# Patient Record
Sex: Male | Born: 1985 | Race: White | Hispanic: No | Marital: Married | Smoking: Never smoker
Health system: Southern US, Community
[De-identification: ages and names within clinical notes are randomized; demographics above are authoritative.]

## PROBLEM LIST (undated history)

## (undated) DIAGNOSIS — I10 Essential (primary) hypertension: Secondary | ICD-10-CM

## (undated) DIAGNOSIS — E119 Type 2 diabetes mellitus without complications: Secondary | ICD-10-CM

## (undated) DIAGNOSIS — E669 Obesity, unspecified: Secondary | ICD-10-CM

## (undated) DIAGNOSIS — R7303 Prediabetes: Secondary | ICD-10-CM

## (undated) HISTORY — PX: WISDOM TOOTH EXTRACTION: SHX21

---

## 2007-05-10 ENCOUNTER — Emergency Department (HOSPITAL_COMMUNITY): Admission: EM | Admit: 2007-05-10 | Discharge: 2007-05-10 | Payer: Self-pay | Admitting: Emergency Medicine

## 2011-04-02 LAB — CULTURE, BLOOD (ROUTINE X 2): Culture: NO GROWTH

## 2011-04-02 LAB — COMPREHENSIVE METABOLIC PANEL
ALT: 52
AST: 32
Albumin: 3.7
Alkaline Phosphatase: 57
BUN: 9
CO2: 27
Calcium: 9.3
Chloride: 102
Creatinine, Ser: 1.05
GFR calc Af Amer: >60
GFR calc non Af Amer: >60
Glucose, Bld: 117 — ABNORMAL HIGH
Potassium: 3.8
Sodium: 137
Total Bilirubin: 0.9
Total Protein: 6.8

## 2011-04-02 LAB — CBC
HCT: 41.6
Hemoglobin: 14.6
MCHC: 35.2
MCV: 84.3
Platelets: 203
RBC: 4.93
RDW: 12.9
WBC: 10.4

## 2011-04-02 LAB — URINALYSIS, ROUTINE W REFLEX MICROSCOPIC
Bilirubin Urine: NEGATIVE
Hgb urine dipstick: NEGATIVE
Ketones, ur: 15 — AB
Leukocytes, UA: NEGATIVE
Nitrite: NEGATIVE
pH: 7.5

## 2011-04-02 LAB — INFLUENZA A+B VIRUS AG-DIRECT(RAPID)
Inflenza A Ag: NEGATIVE
Influenza B Ag: NEGATIVE

## 2011-04-02 LAB — DIFFERENTIAL
Basophils Absolute: 0
Neutrophils Relative %: 85 — ABNORMAL HIGH

## 2011-04-02 LAB — RAPID STREP SCREEN (MED CTR MEBANE ONLY): Streptococcus, Group A Screen (Direct): NEGATIVE

## 2011-04-02 LAB — URINE MICROSCOPIC-ADD ON

## 2013-11-21 ENCOUNTER — Emergency Department: Payer: Self-pay | Admitting: Emergency Medicine

## 2013-11-23 ENCOUNTER — Emergency Department: Payer: Self-pay | Admitting: Emergency Medicine

## 2014-07-21 ENCOUNTER — Emergency Department: Payer: Self-pay | Admitting: Emergency Medicine

## 2015-02-04 ENCOUNTER — Emergency Department (HOSPITAL_COMMUNITY)
Admission: EM | Admit: 2015-02-04 | Discharge: 2015-02-05 | Disposition: A | Discharge status: Home / Self Care | Payer: Self-pay | Attending: Emergency Medicine | Admitting: Emergency Medicine

## 2015-02-04 ENCOUNTER — Encounter (HOSPITAL_COMMUNITY): Payer: Self-pay | Admitting: Emergency Medicine

## 2015-02-04 DIAGNOSIS — L600 Ingrowing nail: Secondary | ICD-10-CM | POA: Insufficient documentation

## 2015-02-04 HISTORY — DX: Prediabetes: R73.03

## 2015-02-04 LAB — CBG MONITORING, ED: GLUCOSE-CAPILLARY: 112 mg/dL — AB (ref 65–99)

## 2015-02-04 MED ORDER — LIDOCAINE HCL (PF) 1 % IJ SOLN
30.0000 mL | Freq: Once | INTRAMUSCULAR | Status: AC
  Administered 2015-02-05: 30 mL via INTRADERMAL
  Filled 2015-02-04: qty 30

## 2015-02-04 NOTE — ED Notes (Signed)
Patient here with right great toe pain. States presence of ingrown toenail. States he cut it out at home with a pair of nail cutters. Since that time pain has increased. Toe appears mildly swollen, with bloody skin flap on medial aspect, color similar to rest of foot. Ambulatory with pain.

## 2015-02-04 NOTE — ED Provider Notes (Signed)
CSN: [id number]     Arrival date & time 02/04/15  2157 History  This chart was scribed for John Forth, PA-C, working with John Nay, MD by Elon Spanner, ED Scribe. This patient was seen in room TR06C/TR06C and the patient's care was started at 11:19 PM.   Chief Complaint  Patient presents with  . Nail Problem  . Toe Pain   The history is provided by the patient. No language interpreter was used.    HPI Comments: John Koch is a 29 y.o. male who presents to the Emergency Department complaining of stinging/burning/tingling/throbbing right toe pain onset weeks ago with exacerbation 5 hoursago.  He reports he used an OTC implement to remove a painful in-grown great toe and has had pain and moderate drainage since.  This evening, his daughter jumped onto the toe twice, causing an exacerbation of the pain.  He has cleaned the area with peroxide.  The pain is worsened with touch and the patient stands a significant amount for his job.  The patient has taken ibuprofen.  He denies fever chills, nausea, vomiting.  Past Medical History  Diagnosis Date  . Borderline diabetes mellitus    History reviewed. No pertinent past surgical history. History reviewed. No pertinent family history. Social History  Substance Use Topics  . Smoking status: Never Smoker   . Smokeless tobacco: None  . Alcohol Use: Yes    Review of Systems  Constitutional: Negative for fever, chills, diaphoresis, appetite change, fatigue and unexpected weight change.  HENT: Negative for mouth sores.   Eyes: Negative for visual disturbance.  Respiratory: Negative for cough, chest tightness, shortness of breath and wheezing.   Cardiovascular: Negative for chest pain.  Gastrointestinal: Negative for nausea, vomiting, abdominal pain, diarrhea and constipation.  Endocrine: Negative for polydipsia, polyphagia and polyuria.  Genitourinary: Negative for dysuria, urgency, frequency and hematuria.  Musculoskeletal:  Negative for back pain and neck stiffness.  Skin: Positive for color change and wound. Negative for rash.  Allergic/Immunologic: Negative for immunocompromised state.  Neurological: Negative for syncope, light-headedness and headaches.  Hematological: Does not bruise/bleed easily.  Psychiatric/Behavioral: Negative for sleep disturbance. The patient is not nervous/anxious.       Allergies  Review of patient's allergies indicates no known allergies.  Home Medications   Prior to Admission medications   Medication Sig Start Date End Date Taking? Authorizing Provider  cephALEXin (KEFLEX) 500 MG capsule Take 1 capsule (500 mg total) by mouth 4 (four) times daily. 02/05/15   Sadey Yandell, PA-C   BP 127/63 mmHg  Pulse 105  Temp(Src) 98.8 F (37.1 C)  Resp 16  Ht 5\' 8"  (1.727 m)  Wt 339 lb 4.8 oz (153.905 kg)  BMI 51.60 kg/m2  SpO2 100% Physical Exam  Constitutional: He appears well-developed and well-nourished. No distress.  HENT:  Head: Normocephalic and atraumatic.  Eyes: Conjunctivae are normal.  Neck: Normal range of motion.  Cardiovascular: Normal rate, regular rhythm and intact distal pulses.   Capillary refill < 3 sec  Pulmonary/Chest: Effort normal and breath sounds normal.  Musculoskeletal: He exhibits tenderness. He exhibits no edema.  ROM: FROM of all joints in the right lower extremity.  Neurological: He is alert. Coordination normal.  Sensation intact to dull and sharp Strength 5/5 in lower extremity  Skin: Skin is warm and dry. He is not diaphoretic.  Right great toe with erythema and swelling to the lateral fold with significant TTP, evidence of in-grown toe nail  Psychiatric: He has a normal mood and  affect.  Nursing note and vitals reviewed.   ED Course  NAIL REMOVAL Date/Time: 02/05/2015 12:02 AM Performed by: John Koch Authorized by: John Koch Consent: Verbal consent obtained. Risks and benefits: risks, benefits and  alternatives were discussed Consent given by: patient Patient understanding: patient states understanding of the procedure being performed Patient consent: the patient's understanding of the procedure matches consent given Procedure consent: procedure consent matches procedure scheduled Relevant documents: relevant documents present and verified Site marked: the operative site was marked Required items: required blood products, implants, devices, and special equipment available Patient identity confirmed: verbally with patient and arm band Time out: Immediately prior to procedure a "time out" was called to verify the correct patient, procedure, equipment, support staff and site/side marked as required. Location: right foot Location details: right big toe Anesthesia: digital block Local anesthetic: lidocaine 2% with epinephrine Anesthetic total: 6 ml Patient sedated: no Preparation: skin prepped with alcohol Amount removed: 1/3 Side: ulnar Wedge excision of skin of nail fold: no Nail bed sutured: no Dressing: 4x4 Patient tolerance: Patient tolerated the procedure well with no immediate complications   (including critical care time)  DIAGNOSTIC STUDIES: Oxygen Saturation is 92% on RA, adequate by my interpretation.    COORDINATION OF CARE:   11:26 PM Discussed treatment plan with patient at bedside.  Patient acknowledges and agrees with plan.    Labs Review Labs Reviewed  CBG MONITORING, ED - Abnormal; Notable for the following:    Glucose-Capillary 112 (*)    All other components within normal limits    Imaging Review No results found. I personally reviewed and evaluated these images and lab results as part of my medical decision-making.   EKG Interpretation None      MDM   Final diagnoses:  Ingrown left greater toenail   [name] presents with ingrown toenail recurrent for many years but worse in the last several days. Toenail removal done here in the  emergency department with periodic drainage. Wound cleaned. No need for suturing of the nailbed. Patient discharged home with Keflex and referred to [facility]. No evidence of spreading cellulitis or systemic infection.  BP 127/63 mmHg  Pulse 105  Temp(Src) 98.8 F (37.1 C)  Resp 16  Ht  (1.727 m)  Wt 339 lb 4.8 oz (153.905 kg)  BMI 51.60 kg/m2  SpO2 100%  I personally performed the services described in this documentation, which was scribed in my presence. The recorded information has been reviewed and is accurate.   Dahlia Client Noemy Hallmon, PA-C 02/05/15 0300  John Nay, MD 02/05/15 [phone]

## 2015-02-04 NOTE — ED Notes (Signed)
Reports borderline DM. CBG 112 in triage.

## 2015-02-04 NOTE — ED Notes (Signed)
Denies fever.

## 2015-02-05 MED ORDER — CEPHALEXIN 500 MG PO CAPS: 500.0000 mg | ORAL_CAPSULE | Freq: Four times a day (QID) | ORAL | Status: DC

## 2015-02-05 NOTE — Discharge Instructions (Signed)
1. Medications: Keflex, usual home medications 2. Treatment: rest, drink plenty of fluids, keep area clean; soak with warm water 2x per day; use only soap and water 3. Follow Up: Please followup with the foot center in 3 days for discussion of your diagnoses and further evaluation after today's visit; if you do not have a primary care doctor use the resource guide provided to find one; Please return to the ER for  Fever, chills, worsening symptoms    Infected Ingrown Toenail An infected ingrown toenail occurs when the nail edge grows into the skin and bacteria invade the area. Symptoms include pain, tenderness, swelling, and pus drainage from the edge of the nail. Poorly fitting shoes, minor injuries, and improper cutting of the toenail may also contribute to the problem. You should cut your toenails squarely instead of rounding the edges. Do not cut them too short. Avoid tight or pointed toe shoes. Sometimes the ingrown portion of the nail must be removed. If your toenail is removed, it can take 3-4 months for it to re-grow. HOME CARE INSTRUCTIONS   Soak your infected toe in warm water for 20-30 minutes, 2 to 3 times a day.  Packing or dressings applied to the area should be changed daily.  Take medicine as directed and finish them.  Reduce activities and keep your foot elevated when able to reduce swelling and discomfort. Do this until the infection gets better.  Wear sandals or go barefoot as much as possible while the infected area is sensitive.  See your caregiver for follow-up care in 2-3 days if the infection is not better. SEEK MEDICAL CARE IF:  Your toe is becoming more red, swollen or painful. MAKE SURE YOU:   Understand these instructions.  Will watch your condition.  Will get help right away if you are not doing well or get worse. Document Released: 07/18/2004 Document Revised: 09/02/2011 Document Reviewed: 06/06/2008 [facility] Patient Information 2015 [place], [place]. This  information is not intended to replace advice given to you by your health care provider. Make sure you discuss any questions you have with your health care provider.

## 2015-02-05 NOTE — ED Notes (Addendum)
Pt left at this time, left black LG cell phone charger in room. Turned into security.

## 2015-04-10 ENCOUNTER — Other Ambulatory Visit: Payer: Self-pay | Admitting: Family Medicine

## 2015-04-10 ENCOUNTER — Ambulatory Visit
Admission: RE | Admit: 2015-04-10 | Discharge: 2015-04-10 | Disposition: A | Discharge status: Home / Self Care | Payer: BLUE CROSS/BLUE SHIELD | Source: Ambulatory Visit | Attending: Family Medicine | Admitting: Family Medicine

## 2015-04-10 DIAGNOSIS — R109 Unspecified abdominal pain: Secondary | ICD-10-CM

## 2015-06-25 ENCOUNTER — Emergency Department (INDEPENDENT_AMBULATORY_CARE_PROVIDER_SITE_OTHER): Payer: BLUE CROSS/BLUE SHIELD

## 2015-06-25 ENCOUNTER — Emergency Department (INDEPENDENT_AMBULATORY_CARE_PROVIDER_SITE_OTHER)
Admission: EM | Admit: 2015-06-25 | Discharge: 2015-06-25 | Disposition: A | Discharge status: Home / Self Care | Payer: BLUE CROSS/BLUE SHIELD | Source: Home / Self Care

## 2015-06-25 ENCOUNTER — Encounter (HOSPITAL_COMMUNITY): Payer: Self-pay | Admitting: *Deleted

## 2015-06-25 DIAGNOSIS — M25562 Pain in left knee: Secondary | ICD-10-CM

## 2015-06-25 HISTORY — DX: Obesity, unspecified: E66.9

## 2015-06-25 MED ORDER — TRAMADOL HCL 50 MG PO TABS: 50.0000 mg | ORAL_TABLET | Freq: Four times a day (QID) | ORAL | Status: DC | PRN

## 2015-06-25 NOTE — ED Provider Notes (Signed)
CSN: [id number]     Arrival date & time 06/25/15  1510 History   None    Chief Complaint  Patient presents with  . Knee Pain   (Consider location/radiation/quality/duration/timing/severity/associated sxs/prior Treatment) HPI Left knee pain for the last few days, pain score 4, ibuprofen for pain not helping. Also using icy hot. Works as [employer]. Is on knees a lot.  Past Medical History  Diagnosis Date  . Borderline diabetes mellitus   . Obesity    Past Surgical History  Procedure Laterality Date  . Wisdom tooth extraction     No family history on file. Social History  Substance Use Topics  . Smoking status: Never Smoker   . Smokeless tobacco: Current User    Types: Chew  . Alcohol Use: No    Review of Systems ROS +'ve left knee pain, no known trauma  Denies: HEADACHE, NAUSEA, ABDOMINAL PAIN, CHEST PAIN, CONGESTION, DYSURIA, SHORTNESS OF BREATH  Allergies  Review of patient's allergies indicates no known allergies.  Home Medications   Prior to Admission medications   Medication Sig Start Date End Date Taking? Authorizing Provider  cephALEXin (KEFLEX) 500 MG capsule Take 1 capsule (500 mg total) by mouth 4 (four) times daily. 02/05/15   Hannah Muthersbaugh, PA-C  traMADol (ULTRAM) 50 MG tablet Take 1 tablet (50 mg total) by mouth every 6 (six) hours as needed. 06/25/15   Tharon AquasFrank C Patrick, PA   Meds Ordered and Administered this Visit  Medications - No data to display  BP 112/82 mmHg  Pulse 113  Temp(Src) 97.7 F (36.5 C) (Oral)  Resp 18  SpO2 96% No data found.   Physical Exam  Constitutional: He appears well-developed and well-nourished.  HENT:  Head: Normocephalic and atraumatic.  Musculoskeletal: He exhibits tenderness.       Left knee: He exhibits normal range of motion, no swelling and no effusion. Tenderness found. Medial joint line tenderness noted.       Legs: Nursing note and vitals reviewed.   ED Course  Procedures (including critical care  time)  Labs Review Labs Reviewed - No data to display  Imaging Review Dg Knee Complete 4 Views Left  06/25/2015  CLINICAL DATA:  Pain in LEFT knee for 2 days. No known injury. History of obesity with a BMI of 51.6 reported in August of 2016. EXAM: LEFT KNEE - COMPLETE 4+ VIEW COMPARISON:  None. FINDINGS: There is no evidence of fracture, dislocation, or joint effusion. There is no evidence of arthropathy or other focal bone abnormality. No acute soft tissue findings. IMPRESSION: Negative. Electronically Signed   By: Elsie StainJohn T Curnes M.D.   On: 06/25/2015 17:15     Visual Acuity Review  Right Eye Distance:   Left Eye Distance:   Bilateral Distance:    Right Eye Near:   Left Eye Near:    Bilateral Near:         MDM   1. Knee pain, acute, left    Reviewed xray with patient. Suggest continued symptomatic treatment.  rx for tramadol.  Follow up with PCP.     Tharon AquasFrank C Patrick, PA 06/25/15 [phone]

## 2015-06-25 NOTE — Discharge Instructions (Signed)
Heat Therapy  Heat therapy can help ease sore, stiff, injured, and tight muscles and joints. Heat relaxes your muscles, which may help ease your pain.   RISKS AND COMPLICATIONS  If you have any of the following conditions, do not use heat therapy unless your health care provider has approved:   Poor circulation.   Healing wounds or scarred skin in the area being treated.   Diabetes, heart disease, or high blood pressure.   Not being able to feel (numbness) the area being treated.   Unusual swelling of the area being treated.   Active infections.   Blood clots.   Cancer.   Inability to communicate pain. This may include young children and people who have problems with their brain function (dementia).   Pregnancy.  Heat therapy should only be used on old, pre-existing, or long-lasting (chronic) injuries. Do not use heat therapy on new injuries unless directed by your health care provider.  HOW TO USE HEAT THERAPY  There are several different kinds of heat therapy, including:   Moist heat pack.   Warm water bath.   Hot water bottle.   Electric heating pad.   Heated gel pack.   Heated wrap.   Electric heating pad.  Use the heat therapy method suggested by your health care provider. Follow your health care provider's instructions on when and how to use heat therapy.  GENERAL HEAT THERAPY RECOMMENDATIONS   Do not sleep while using heat therapy. Only use heat therapy while you are awake.   Your skin may turn pink while using heat therapy. Do not use heat therapy if your skin turns red.   Do not use heat therapy if you have new pain.   High heat or long exposure to heat can cause burns. Be careful when using heat therapy to avoid burning your skin.   Do not use heat therapy on areas of your skin that are already irritated, such as with a rash or sunburn.  SEEK MEDICAL CARE IF:   You have blisters, redness, swelling, or numbness.   You have new pain.   Your pain is worse.  MAKE SURE  YOU:   Understand these instructions.   Will watch your condition.   Will get help right away if you are not doing well or get worse.     This information is not intended to replace advice given to you by your health care provider. Make sure you discuss any questions you have with your health care provider.     Document Released: 09/02/2011 Document Revised: 07/01/2014 Document Reviewed: 08/03/2013  Elsevier Interactive Patient Education 2016 [employer]

## 2015-06-25 NOTE — ED Notes (Signed)
C/O left knee pain x 2 days without injury.  C/O very painful weight bearing.  [facility] and [facility] without relief.

## 2015-08-03 ENCOUNTER — Encounter (HOSPITAL_COMMUNITY): Payer: Self-pay

## 2015-08-03 ENCOUNTER — Emergency Department (HOSPITAL_COMMUNITY): Payer: BLUE CROSS/BLUE SHIELD

## 2015-08-03 ENCOUNTER — Emergency Department (HOSPITAL_COMMUNITY)
Admission: EM | Admit: 2015-08-03 | Discharge: 2015-08-03 | Disposition: A | Discharge status: Home / Self Care | Payer: BLUE CROSS/BLUE SHIELD | Attending: Emergency Medicine | Admitting: Emergency Medicine

## 2015-08-03 DIAGNOSIS — L600 Ingrowing nail: Secondary | ICD-10-CM | POA: Diagnosis not present

## 2015-08-03 DIAGNOSIS — H66002 Acute suppurative otitis media without spontaneous rupture of ear drum, left ear: Secondary | ICD-10-CM | POA: Insufficient documentation

## 2015-08-03 DIAGNOSIS — R05 Cough: Secondary | ICD-10-CM | POA: Diagnosis present

## 2015-08-03 DIAGNOSIS — E669 Obesity, unspecified: Secondary | ICD-10-CM | POA: Insufficient documentation

## 2015-08-03 MED ORDER — AMOXICILLIN 500 MG PO CAPS: 500.0000 mg | ORAL_CAPSULE | Freq: Three times a day (TID) | ORAL | Status: DC

## 2015-08-03 MED ORDER — NAPROXEN 500 MG PO TABS: 500.0000 mg | ORAL_TABLET | Freq: Two times a day (BID) | ORAL | Status: DC

## 2015-08-03 MED ORDER — AMOXICILLIN 500 MG PO CAPS
500.0000 mg | ORAL_CAPSULE | Freq: Once | ORAL | Status: AC
  Administered 2015-08-03: 500 mg via ORAL
  Filled 2015-08-03: qty 1

## 2015-08-03 MED ORDER — NAPROXEN 500 MG PO TABS
500.0000 mg | ORAL_TABLET | Freq: Once | ORAL | Status: AC
  Administered 2015-08-03: 500 mg via ORAL
  Filled 2015-08-03: qty 1

## 2015-08-03 MED ORDER — BENZONATATE 100 MG PO CAPS
100.0000 mg | ORAL_CAPSULE | Freq: Once | ORAL | Status: AC
  Administered 2015-08-03: 100 mg via ORAL
  Filled 2015-08-03: qty 1

## 2015-08-03 MED ORDER — BENZONATATE 100 MG PO CAPS: 100.0000 mg | ORAL_CAPSULE | Freq: Three times a day (TID) | ORAL | Status: DC

## 2015-08-03 NOTE — Discharge Instructions (Signed)

## 2015-08-03 NOTE — ED Provider Notes (Signed)
CSN: [id number]     Arrival date & time 08/03/15  1829 History   First MD Initiated Contact with Patient 08/03/15 2202     Chief Complaint  Patient presents with  . Cough    HPI Sx started with cough and congestion 4 days ago.  Sx have progressed.  He has had hoarseness and a change in his voice.  He has not measured a fever but he has felt warm.  Tonight he started having a sensation of fluid in his ear, ear pain and decreased hearing.  No shortness of breath.   Past Medical History  Diagnosis Date  . Borderline diabetes mellitus   . Obesity    Past Surgical History  Procedure Laterality Date  . Wisdom tooth extraction     History reviewed. No pertinent family history. Social History  Substance Use Topics  . Smoking status: Never Smoker   . Smokeless tobacco: Current User    Types: Chew  . Alcohol Use: No    Review of Systems  All other systems reviewed and are negative.     Allergies  Review of patient's allergies indicates no known allergies.  Home Medications   Prior to Admission medications   Medication Sig Start Date End Date Taking? Authorizing Provider  Pseudoeph-Doxylamine-DM-APAP (NYQUIL D COLD/FLU PO) Take 30 mLs by mouth at bedtime as needed (sleep, cold/flu symptoms).   Yes Historical Provider, MD  Pseudoephedrine-APAP-DM (DAYQUIL MULTI-SYMPTOM PO) Take 2 capsules by mouth every 6 (six) hours as needed (cold symptoms).   Yes Historical Provider, MD  amoxicillin (AMOXIL) 500 MG capsule Take 1 capsule (500 mg total) by mouth 3 (three) times daily. 08/03/15   Linwood Dibbles, MD  benzonatate (TESSALON) 100 MG capsule Take 1 capsule (100 mg total) by mouth every 8 (eight) hours. 08/03/15   Linwood Dibbles, MD  cephALEXin (KEFLEX) 500 MG capsule Take 1 capsule (500 mg total) by mouth 4 (four) times daily. Patient not taking: Reported on 08/03/2015 02/05/15   Dahlia Client Muthersbaugh, PA-C  naproxen (NAPROSYN) 500 MG tablet Take 1 tablet (500 mg total) by mouth 2 (two) times daily. 08/03/15    Linwood Dibbles, MD  traMADol (ULTRAM) 50 MG tablet Take 1 tablet (50 mg total) by mouth every 6 (six) hours as needed. Patient not taking: Reported on 08/03/2015 06/25/15   Tharon Aquas, PA   BP 122/74 mmHg  Pulse 109  Temp(Src) 98.5 F (36.9 C) (Oral)  Resp 22  SpO2 99% Physical Exam  Constitutional: He appears well-developed and well-nourished. No distress.  HENT:  Head: Normocephalic and atraumatic.  Right Ear: Tympanic membrane and external ear normal.  Left Ear: External ear normal. Tympanic membrane is injected, erythematous and bulging.  Eyes: Conjunctivae are normal. Right eye exhibits no discharge. Left eye exhibits no discharge. No scleral icterus.  Neck: Full passive range of motion without pain. Neck supple. No tracheal deviation present.  Cardiovascular: Normal rate, regular rhythm and intact distal pulses.   Pulmonary/Chest: Effort normal and breath sounds normal. No stridor. No respiratory distress. He has no wheezes. He has no rales.  Abdominal: Soft. Bowel sounds are normal. He exhibits no distension. There is no tenderness. There is no rebound and no guarding.  Musculoskeletal: He exhibits no edema or tenderness.  Chronic ingrown toenail right big toe, hypertrophied cuticle tissue, no erythema or drainage  Neurological: He is alert. He has normal strength. No cranial nerve deficit (no facial droop, extraocular movements intact, no slurred speech) or sensory deficit. He exhibits normal  muscle tone. He displays no seizure activity. Coordination normal.  Skin: Skin is warm and dry. No rash noted.  Psychiatric: He has a normal mood and affect.  Nursing note and vitals reviewed.   ED Course  Procedures (including critical care time) Labs Review Labs Reviewed - No data to display  Imaging Review Dg Chest 2 View  08/03/2015  CLINICAL DATA:  Cough and congestion for 4 days. EXAM: CHEST  2 VIEW COMPARISON:  April 10, 2015 FINDINGS: The heart size and mediastinal contours are  within normal limits. There is no focal infiltrate, pulmonary edema, or pleural effusion. The visualized skeletal structures are unremarkable. IMPRESSION: No active cardiopulmonary disease. Electronically Signed   By: Sherian Rein M.D.   On: 08/03/2015 19:26   I have personally reviewed and evaluated these images and lab results as part of my medical decision-making.    MDM   Final diagnoses:  Acute suppurative otitis media of left ear without spontaneous rupture of tympanic membrane, recurrence not specified    The patient's exam shows an acute otitis media left ear. Otherwise no other serious infection. Chest x-ray does not show pneumonia  The patient also asked to have his toenail examined.  No evidence of acute infection.  Consider follow up with a podiatrist for his chronic ingrown toenail.    Linwood Dibbles, MD 08/03/15 2233

## 2015-08-03 NOTE — ED Notes (Signed)
Pt with cough x 4 days.  No fever.  States some fever the other night.  Congested.  No n/v.  Also wants toe checked.

## 2016-05-08 ENCOUNTER — Encounter (HOSPITAL_COMMUNITY): Payer: Self-pay | Admitting: *Deleted

## 2016-05-08 ENCOUNTER — Emergency Department (HOSPITAL_COMMUNITY)
Admission: EM | Admit: 2016-05-08 | Discharge: 2016-05-09 | Disposition: A | Discharge status: Home / Self Care | Payer: BLUE CROSS/BLUE SHIELD | Attending: Emergency Medicine | Admitting: Emergency Medicine

## 2016-05-08 DIAGNOSIS — E119 Type 2 diabetes mellitus without complications: Secondary | ICD-10-CM | POA: Diagnosis not present

## 2016-05-08 DIAGNOSIS — R1110 Vomiting, unspecified: Secondary | ICD-10-CM

## 2016-05-08 DIAGNOSIS — R197 Diarrhea, unspecified: Secondary | ICD-10-CM | POA: Diagnosis not present

## 2016-05-08 HISTORY — DX: Type 2 diabetes mellitus without complications: E11.9

## 2016-05-08 LAB — CBC WITH DIFFERENTIAL/PLATELET
Basophils Absolute: 0 10*3/uL (ref 0.0–0.1)
Basophils Relative: 0 %
EOS ABS: 0 10*3/uL (ref 0.0–0.7)
EOS PCT: 0 %
HCT: 40.2 % (ref 39.0–52.0)
Hemoglobin: 13.7 g/dL (ref 13.0–17.0)
LYMPHS ABS: 1.9 10*3/uL (ref 0.7–4.0)
LYMPHS PCT: 15 %
MCH: 27.5 pg (ref 26.0–34.0)
MCHC: 34.1 g/dL (ref 30.0–36.0)
MCV: 80.6 fL (ref 78.0–100.0)
MONO ABS: 0.3 10*3/uL (ref 0.1–1.0)
MONOS PCT: 3 %
Neutro Abs: 10 10*3/uL — ABNORMAL HIGH (ref 1.7–7.7)
Neutrophils Relative %: 82 %
PLATELETS: 214 10*3/uL (ref 150–400)
RBC: 4.99 MIL/uL (ref 4.22–5.81)
RDW: 13.9 % (ref 11.5–15.5)
WBC: 12.3 10*3/uL — AB (ref 4.0–10.5)

## 2016-05-08 LAB — URINALYSIS, ROUTINE W REFLEX MICROSCOPIC
BILIRUBIN URINE: NEGATIVE
Glucose, UA: NEGATIVE mg/dL
Hgb urine dipstick: NEGATIVE
Ketones, ur: NEGATIVE mg/dL
LEUKOCYTES UA: NEGATIVE
NITRITE: NEGATIVE
PH: 5.5 (ref 5.0–8.0)
Protein, ur: NEGATIVE mg/dL
SPECIFIC GRAVITY, URINE: 1.022 (ref 1.005–1.030)

## 2016-05-08 LAB — COMPREHENSIVE METABOLIC PANEL
ALT: 91 U/L — ABNORMAL HIGH (ref 17–63)
ANION GAP: 7 (ref 5–15)
AST: 55 U/L — ABNORMAL HIGH (ref 15–41)
Albumin: 3.7 g/dL (ref 3.5–5.0)
Alkaline Phosphatase: 54 U/L (ref 38–126)
BUN: 11 mg/dL (ref 6–20)
CHLORIDE: 105 mmol/L (ref 101–111)
CO2: 25 mmol/L (ref 22–32)
CREATININE: 1.2 mg/dL (ref 0.61–1.24)
Calcium: 8.9 mg/dL (ref 8.9–10.3)
Glucose, Bld: 93 mg/dL (ref 65–99)
POTASSIUM: 3.7 mmol/L (ref 3.5–5.1)
SODIUM: 137 mmol/L (ref 135–145)
Total Bilirubin: 0.7 mg/dL (ref 0.3–1.2)
Total Protein: 6.9 g/dL (ref 6.5–8.1)

## 2016-05-08 NOTE — ED Notes (Signed)
Pt very agitated about wait time. Pt stated he should just leave. This tech apologized for the wait, explained we are working very hard to get him back ASAP. RN explained he will be next to be called. Pt seemed calmer and agreed to wait.

## 2016-05-08 NOTE — ED Triage Notes (Signed)
Patient presents stating he has been having vomiting and diarrhea since Saturday morning.

## 2016-05-09 MED ORDER — SODIUM CHLORIDE 0.9 % IV BOLUS (SEPSIS)
1000.0000 mL | Freq: Once | INTRAVENOUS | Status: AC
  Administered 2016-05-09: 1000 mL via INTRAVENOUS

## 2016-05-09 MED ORDER — ONDANSETRON HCL 4 MG/2ML IJ SOLN
4.0000 mg | Freq: Once | INTRAMUSCULAR | Status: AC
  Administered 2016-05-09: 4 mg via INTRAVENOUS
  Filled 2016-05-09: qty 2

## 2016-05-09 MED ORDER — DICYCLOMINE HCL 20 MG PO TABS: 20.0000 mg | ORAL_TABLET | Freq: Two times a day (BID) | ORAL | 0 refills | Status: DC

## 2016-05-09 MED ORDER — DICYCLOMINE HCL 10 MG/ML IM SOLN
20.0000 mg | Freq: Once | INTRAMUSCULAR | Status: AC
  Administered 2016-05-09: 20 mg via INTRAMUSCULAR
  Filled 2016-05-09: qty 2

## 2016-05-09 MED ORDER — ONDANSETRON 4 MG PO TBDP: 4.0000 mg | ORAL_TABLET | Freq: Three times a day (TID) | ORAL | 0 refills | Status: DC | PRN

## 2016-05-09 NOTE — ED Provider Notes (Signed)
[facility]-EMERGENCY DEPT Provider Note   CSN: [id number] Arrival date & time: 05/08/16  1950   History   Chief Complaint Chief Complaint  Patient presents with  . Emesis  . Diarrhea    HPI John Koch is a 30 y.o. male.  HPI   Patient to the ER with complaints of diabetes and obesity. He reports eating at a fair this past Friday and getting sick that evening with vomiting. Since then he has been having vomiting and diarrhea. The vomiting was NBNB, painless and resolved a few days ago. He continues to have diarrhea, brown/yellow/watery. No blood or abdominal pain. It improves with imodium for 3-4 hours and then returns. He is having body aches associated with this and says he feels poorly. Denies fevers, back pain, dysuria, abdominal pain, chest pain, weakness, confusion, or being unable to tolerate PO.  Past Medical History:  Diagnosis Date  . Borderline diabetes mellitus   . Diabetes mellitus without complication (HCC)   . Obesity     There are no active problems to display for this patient.   Past Surgical History:  Procedure Laterality Date  . WISDOM TOOTH EXTRACTION      Home Medications    Prior to Admission medications   Medication Sig Start Date End Date Taking? Authorizing Provider  amoxicillin (AMOXIL) 500 MG capsule Take 1 capsule (500 mg total) by mouth 3 (three) times daily. 08/03/15   Linwood DibblesJon Knapp, MD  benzonatate (TESSALON) 100 MG capsule Take 1 capsule (100 mg total) by mouth every 8 (eight) hours. 08/03/15   Linwood DibblesJon Knapp, MD  cephALEXin (KEFLEX) 500 MG capsule Take 1 capsule (500 mg total) by mouth 4 (four) times daily. Patient not taking: Reported on 08/03/2015 02/05/15   Dahlia ClientHannah Muthersbaugh, PA-C  dicyclomine (BENTYL) 20 MG tablet Take 1 tablet (20 mg total) by mouth 2 (two) times daily. 05/09/16   Marlon Peliffany Augustine Brannick, PA-C  naproxen (NAPROSYN) 500 MG tablet Take 1 tablet (500 mg total) by mouth 2 (two) times daily. 08/03/15   Linwood DibblesJon Knapp, MD  ondansetron (ZOFRAN ODT) 4 MG  disintegrating tablet Take 1 tablet (4 mg total) by mouth every 8 (eight) hours as needed for nausea or vomiting. 05/09/16   Emonee Winkowski Neva SeatGreene, PA-C  Pseudoeph-Doxylamine-DM-APAP (NYQUIL D COLD/FLU PO) Take 30 mLs by mouth at bedtime as needed (sleep, cold/flu symptoms).    Historical Provider, MD  Pseudoephedrine-APAP-DM (DAYQUIL MULTI-SYMPTOM PO) Take 2 capsules by mouth every 6 (six) hours as needed (cold symptoms).    Historical Provider, MD  traMADol (ULTRAM) 50 MG tablet Take 1 tablet (50 mg total) by mouth every 6 (six) hours as needed. Patient not taking: Reported on 08/03/2015 06/25/15   Tharon AquasFrank C Patrick, PA    Family History No family history on file.  Social History Social History  Substance Use Topics  . Smoking status: Never Smoker  . Smokeless tobacco: Current User    Types: Chew  . Alcohol use No     Allergies   Patient has no known allergies.   Review of Systems Review of Systems  Review of Systems All other systems negative except as documented in the HPI. All pertinent positives and negatives as reviewed in the HPI.  Physical Exam Updated Vital Signs BP 113/66   Pulse 105   Temp 98.1 F (36.7 C) (Oral)   Resp 24   SpO2 96%   Physical Exam  Constitutional: He is oriented to person, place, and time. He appears well-developed and well-nourished.  HENT:  Head: Normocephalic  and atraumatic.  Eyes: EOM are normal. Pupils are equal, round, and reactive to light.  Neck: Normal range of motion.  Cardiovascular: Normal rate and regular rhythm.   Pulmonary/Chest: Effort normal and breath sounds normal.  Abdominal: He exhibits no distension and no ascites. Bowel sounds are increased. There is no rigidity, no rebound, no guarding and no CVA tenderness.  Obese abdomen.  Musculoskeletal: Normal range of motion.  Neurological: He is alert and oriented to person, place, and time.  Skin: Skin is warm and dry.     ED Treatments / Results  Labs (all labs ordered are  listed, but only abnormal results are displayed) Labs Reviewed  CBC WITH DIFFERENTIAL/PLATELET - Abnormal; Notable for the following:       Result Value   WBC 12.3 (*)    Neutro Abs 10.0 (*)    All other components within normal limits  COMPREHENSIVE METABOLIC PANEL - Abnormal; Notable for the following:    AST 55 (*)    ALT 91 (*)    All other components within normal limits  URINALYSIS, ROUTINE W REFLEX MICROSCOPIC (NOT [facility])    EKG  EKG Interpretation None       Radiology No results found.  Procedures Procedures (including critical care time)  Medications Ordered in ED Medications  sodium chloride 0.9 % bolus 1,000 mL (1,000 mLs Intravenous New Bag/Given 05/09/16 0049)  ondansetron (ZOFRAN) injection 4 mg (4 mg Intravenous Given 05/09/16 0049)  dicyclomine (BENTYL) injection 20 mg (20 mg Intramuscular Given 05/09/16 0049)     Initial Impression / Assessment and Plan / ED Course  I have reviewed the triage vital signs and the nursing notes.  Pertinent labs & imaging results that were available during my care of the patient were reviewed by me and considered in my medical decision making (see chart for details).  Clinical Course     Patient reports resolution of symptoms with fluids, bentyl and zofran. Able to tolerate PO. He is requesting home, discussed return precautions such as fever, abdominal pains, back pain, worsening of vomiting or diarrhea.  Blood pressure 113/66, pulse 105, temperature 98.1 F (36.7 C), temperature source Oral, resp. rate 24, SpO2 96 %.   Final Clinical Impressions(s) / ED Diagnoses   Final diagnoses:  Vomiting and diarrhea    New Prescriptions New Prescriptions   DICYCLOMINE (BENTYL) 20 MG TABLET    Take 1 tablet (20 mg total) by mouth 2 (two) times daily.   ONDANSETRON (ZOFRAN ODT) 4 MG DISINTEGRATING TABLET    Take 1 tablet (4 mg total) by mouth every 8 (eight) hours as needed for nausea or vomiting.     Marlon Peliffany Hilbert Briggs,  PA-C 05/09/16 [phone]    Layla MawKristen N Ward, DO 05/09/16 [phone]

## 2019-10-31 ENCOUNTER — Other Ambulatory Visit: Payer: Self-pay

## 2019-10-31 ENCOUNTER — Emergency Department (HOSPITAL_COMMUNITY)
Admission: EM | Admit: 2019-10-31 | Discharge: 2019-10-31 | Disposition: A | Discharge status: Home / Self Care | Payer: BC Managed Care – PPO | Attending: Emergency Medicine | Admitting: Emergency Medicine

## 2019-10-31 ENCOUNTER — Encounter (HOSPITAL_COMMUNITY): Payer: Self-pay | Admitting: Emergency Medicine

## 2019-10-31 DIAGNOSIS — Z79899 Other long term (current) drug therapy: Secondary | ICD-10-CM | POA: Diagnosis not present

## 2019-10-31 DIAGNOSIS — E119 Type 2 diabetes mellitus without complications: Secondary | ICD-10-CM | POA: Insufficient documentation

## 2019-10-31 DIAGNOSIS — L089 Local infection of the skin and subcutaneous tissue, unspecified: Secondary | ICD-10-CM | POA: Diagnosis not present

## 2019-10-31 DIAGNOSIS — J340 Abscess, furuncle and carbuncle of nose: Secondary | ICD-10-CM | POA: Insufficient documentation

## 2019-10-31 DIAGNOSIS — J3489 Other specified disorders of nose and nasal sinuses: Secondary | ICD-10-CM | POA: Diagnosis present

## 2019-10-31 LAB — CBG MONITORING, ED: Glucose-Capillary: 258 mg/dL — ABNORMAL HIGH (ref 70–99)

## 2019-10-31 MED ORDER — MUPIROCIN CALCIUM 2 % NA OINT: TOPICAL_OINTMENT | NASAL | 1 refills | Status: DC

## 2019-10-31 MED ORDER — DOXYCYCLINE HYCLATE 100 MG PO CAPS: 100.0000 mg | ORAL_CAPSULE | Freq: Two times a day (BID) | ORAL | 0 refills | Status: DC

## 2019-10-31 MED ORDER — LIDOCAINE HCL (PF) 1 % IJ SOLN
5.0000 mL | Freq: Once | INTRAMUSCULAR | Status: AC
  Administered 2019-10-31: 5 mL
  Filled 2019-10-31: qty 5

## 2019-10-31 MED ORDER — DOXYCYCLINE HYCLATE 100 MG PO TABS
100.0000 mg | ORAL_TABLET | Freq: Once | ORAL | Status: AC
  Administered 2019-10-31: 21:00:00 100 mg via ORAL
  Filled 2019-10-31: qty 1

## 2019-10-31 NOTE — Discharge Instructions (Signed)
Please read and follow all provided instructions.  Your diagnoses today include:  1. Soft tissue infection     Tests performed today include:  Vital signs. See below for your results today.   Blood sugar - in 200's  Medications prescribed:   Doxycycline - antibiotic  You have been prescribed an antibiotic medicine: take the entire course of medicine even if you are feeling better. Stopping early can cause the antibiotic not to work.   Bactroban ointment - topical antibiotic ointment  Take any prescribed medications only as directed.   Home care instructions:   Follow any educational materials contained in this packet  Follow-up instructions: See your doctor in the next 48 to 72 hours for recheck of the area to make sure it is getting better.  Return instructions:  Return to the Emergency Department if you have:  Fever  Worsening symptoms  Worsening pain  Worsening swelling  Redness of the skin that moves away from the affected area, especially if it streaks away from the affected area   Any other emergent concerns  Your vital signs today were: BP (!) 155/90    Pulse 95    Temp 98.7 F (37.1 C) (Oral)    Resp 18    Ht 5\' 8"  (1.727 m)    Wt (!) 158.8 kg    SpO2 100%    BMI 53.22 kg/m  If your blood pressure (BP) was elevated above 135/85 this visit, please have this repeated by your doctor within one month. --------------
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
## 2019-10-31 NOTE — ED Notes (Signed)
Patient verbalizes understanding of discharge instructions. Opportunity for questioning and answers were provided. Armband removed by staff, pt discharged from ED.  

## 2019-10-31 NOTE — ED Notes (Signed)
Provider at bedside
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
## 2020-01-11 ENCOUNTER — Other Ambulatory Visit: Payer: Self-pay

## 2020-01-11 ENCOUNTER — Emergency Department (HOSPITAL_COMMUNITY): Payer: BC Managed Care – PPO

## 2020-01-11 ENCOUNTER — Encounter (HOSPITAL_COMMUNITY): Payer: Self-pay

## 2020-01-11 ENCOUNTER — Emergency Department (HOSPITAL_COMMUNITY)
Admission: EM | Admit: 2020-01-11 | Discharge: 2020-01-11 | Disposition: A | Discharge status: Home / Self Care | Payer: BC Managed Care – PPO | Attending: Emergency Medicine | Admitting: Emergency Medicine

## 2020-01-11 DIAGNOSIS — R161 Splenomegaly, not elsewhere classified: Secondary | ICD-10-CM | POA: Insufficient documentation

## 2020-01-11 DIAGNOSIS — R197 Diarrhea, unspecified: Secondary | ICD-10-CM | POA: Diagnosis not present

## 2020-01-11 DIAGNOSIS — R1084 Generalized abdominal pain: Secondary | ICD-10-CM

## 2020-01-11 DIAGNOSIS — I10 Essential (primary) hypertension: Secondary | ICD-10-CM | POA: Diagnosis not present

## 2020-01-11 DIAGNOSIS — R112 Nausea with vomiting, unspecified: Secondary | ICD-10-CM | POA: Insufficient documentation

## 2020-01-11 DIAGNOSIS — E119 Type 2 diabetes mellitus without complications: Secondary | ICD-10-CM | POA: Insufficient documentation

## 2020-01-11 HISTORY — DX: Essential (primary) hypertension: I10

## 2020-01-11 LAB — COMPREHENSIVE METABOLIC PANEL
ALT: 32 U/L (ref 0–44)
AST: 22 U/L (ref 15–41)
Albumin: 3.4 g/dL — ABNORMAL LOW (ref 3.5–5.0)
Alkaline Phosphatase: 67 U/L (ref 38–126)
Anion gap: 7 (ref 5–15)
BUN: 9 mg/dL (ref 6–20)
CO2: 23 mmol/L (ref 22–32)
Calcium: 8.3 mg/dL — ABNORMAL LOW (ref 8.9–10.3)
Chloride: 105 mmol/L (ref 98–111)
Creatinine, Ser: 0.89 mg/dL (ref 0.61–1.24)
GFR calc Af Amer: >60 mL/min (ref >60)
GFR calc non Af Amer: >60 mL/min (ref >60)
Glucose, Bld: 113 mg/dL — ABNORMAL HIGH (ref 70–99)
Potassium: 3.8 mmol/L (ref 3.5–5.1)
Sodium: 135 mmol/L (ref 135–145)
Total Bilirubin: 0.5 mg/dL (ref 0.3–1.2)
Total Protein: 6.4 g/dL — ABNORMAL LOW (ref 6.5–8.1)

## 2020-01-11 LAB — CBC
HCT: 41 % (ref 39.0–52.0)
Hemoglobin: 13.2 g/dL (ref 13.0–17.0)
MCH: 27.8 pg (ref 26.0–34.0)
MCHC: 32.2 g/dL (ref 30.0–36.0)
MCV: 86.5 fL (ref 80.0–100.0)
Platelets: 214 10*3/uL (ref 150–400)
RBC: 4.74 MIL/uL (ref 4.22–5.81)
RDW: 13.3 % (ref 11.5–15.5)
WBC: 7.4 10*3/uL (ref 4.0–10.5)
nRBC: 0 % (ref 0.0–0.2)

## 2020-01-11 LAB — LIPASE, BLOOD: Lipase: 40 U/L (ref 11–51)

## 2020-01-11 LAB — URINALYSIS, ROUTINE W REFLEX MICROSCOPIC
Bilirubin Urine: NEGATIVE
Glucose, UA: NEGATIVE mg/dL
Hgb urine dipstick: NEGATIVE
Ketones, ur: NEGATIVE mg/dL
Leukocytes,Ua: NEGATIVE
Nitrite: NEGATIVE
Protein, ur: NEGATIVE mg/dL
Specific Gravity, Urine: 1.016 (ref 1.005–1.030)
pH: 5 (ref 5.0–8.0)

## 2020-01-11 MED ORDER — ONDANSETRON HCL 4 MG/2ML IJ SOLN
4.0000 mg | Freq: Once | INTRAMUSCULAR | Status: AC
  Administered 2020-01-11: 4 mg via INTRAVENOUS
  Filled 2020-01-11: qty 2

## 2020-01-11 MED ORDER — IOHEXOL 300 MG/ML  SOLN
100.0000 mL | Freq: Once | INTRAMUSCULAR | Status: AC | PRN
  Administered 2020-01-11: 100 mL via INTRAVENOUS

## 2020-01-11 MED ORDER — MORPHINE SULFATE (PF) 4 MG/ML IV SOLN
4.0000 mg | Freq: Once | INTRAVENOUS | Status: AC
  Administered 2020-01-11: 4 mg via INTRAVENOUS
  Filled 2020-01-11: qty 1

## 2020-01-11 MED ORDER — ONDANSETRON 4 MG PO TBDP: 4.0000 mg | ORAL_TABLET | Freq: Three times a day (TID) | ORAL | 0 refills | Status: DC | PRN

## 2020-01-11 MED ORDER — LOPERAMIDE HCL 2 MG PO CAPS: 2.0000 mg | ORAL_CAPSULE | Freq: Four times a day (QID) | ORAL | 0 refills | Status: DC | PRN

## 2020-01-11 MED ORDER — PANTOPRAZOLE SODIUM 40 MG PO TBEC
40.0000 mg | DELAYED_RELEASE_TABLET | Freq: Once | ORAL | Status: AC
  Administered 2020-01-11: 40 mg via ORAL
  Filled 2020-01-11: qty 1

## 2020-01-11 MED ORDER — SODIUM CHLORIDE 0.9 % IV BOLUS
1000.0000 mL | Freq: Once | INTRAVENOUS | Status: AC
  Administered 2020-01-11: 1000 mL via INTRAVENOUS

## 2020-01-11 MED ORDER — FAMOTIDINE 20 MG PO TABS: 20.0000 mg | ORAL_TABLET | Freq: Two times a day (BID) | ORAL | 0 refills | Status: DC

## 2020-01-11 NOTE — Discharge Instructions (Addendum)
You were seen in the emergency department for nausea vomiting diarrhea and abdominal pain. Your lab work did not show any significant abnormalities. Your CAT scan also was fairly unremarkable other than showing your spleen was moderately enlarged. We are prescribing you some nausea and diarrhea medication along with some acid medication for your stomach. Please contact your primary care doctor for close follow-up. Return to the emergency department if any worsening or concerning symptoms.

## 2020-01-11 NOTE — ED Notes (Signed)
Patient is tolerating oral fluids, meal given

## 2020-01-11 NOTE — ED Provider Notes (Signed)
[facility] EMERGENCY DEPARTMENT Provider Note   CSN: [id number] Arrival date & time: 01/11/20  1013     History Chief Complaint  Patient presents with  . Abdominal Pain  . Emesis  . Diarrhea    John Koch is a 34 y.o. male.  He has a history of hypertension borderline diabetes.  Complaining of nausea vomiting diarrhea and generalized abdominal pain that started 4 days ago.  No sick contacts or recent travel.  Rates the pain as 5 out of 10.  Has not noticed any fever or chills.  No blood from above or below.  No prior surgical history.  Said he went to urgent care was given Zofran without any improvement.  The history is provided by the patient.  Abdominal Pain Pain location:  Generalized Pain quality: cramping   Pain radiates to:  Does not radiate Pain severity:  Moderate Onset quality:  Sudden Duration:  4 days Timing:  Intermittent Progression:  Unchanged Chronicity:  New Context: not recent travel, not sick contacts, not suspicious food intake and not trauma   Relieved by:  Nothing Worsened by:  Nothing Ineffective treatments:  None tried Associated symptoms: diarrhea, nausea and vomiting   Associated symptoms: no chest pain, no chills, no constipation, no cough, no dysuria, no fever, no hematemesis, no hematochezia, no hematuria, no melena, no shortness of breath and no sore throat   Emesis Associated symptoms: abdominal pain and diarrhea   Associated symptoms: no chills, no cough, no fever, no headaches and no sore throat   Diarrhea Associated symptoms: abdominal pain and vomiting   Associated symptoms: no chills, no fever and no headaches        Past Medical History:  Diagnosis Date  . Borderline diabetes mellitus   . Diabetes mellitus without complication (HCC)   . Hypertension   . Obesity     There are no problems to display for this patient.   Past Surgical History:  Procedure Laterality Date  . WISDOM TOOTH EXTRACTION          No family history on file.  Social History   Tobacco Use  . Smoking status: Never Smoker  . Smokeless tobacco: Current User    Types: Chew  Substance Use Topics  . Alcohol use: No  . Drug use: No    Home Medications Prior to Admission medications   Medication Sig Start Date End Date Taking? Authorizing Provider  benzonatate (TESSALON) 100 MG capsule Take 1 capsule (100 mg total) by mouth every 8 (eight) hours. 08/03/15   Linwood Dibbles, MD  dicyclomine (BENTYL) 20 MG tablet Take 1 tablet (20 mg total) by mouth 2 (two) times daily. 05/09/16   Marlon Pel, PA-C  doxycycline (VIBRAMYCIN) 100 MG capsule Take 1 capsule (100 mg total) by mouth 2 (two) times daily. 10/31/19   Renne Crigler, PA-C  mupirocin nasal ointment (BACTROBAN) 2 % Apply in each nostril daily 10/31/19   Renne Crigler, PA-C  naproxen (NAPROSYN) 500 MG tablet Take 1 tablet (500 mg total) by mouth 2 (two) times daily. 08/03/15   Linwood Dibbles, MD  ondansetron (ZOFRAN ODT) 4 MG disintegrating tablet Take 1 tablet (4 mg total) by mouth every 8 (eight) hours as needed for nausea or vomiting. 05/09/16   Neva Seat, Tiffany, PA-C  Pseudoeph-Doxylamine-DM-APAP (NYQUIL D COLD/FLU PO) Take 30 mLs by mouth at bedtime as needed (sleep, cold/flu symptoms).    [provider]  Pseudoephedrine-APAP-DM (DAYQUIL MULTI-SYMPTOM PO) Take 2 capsules by mouth every 6 (six)  hours as needed (cold symptoms).    [provider]    Allergies    Patient has no known allergies.  Review of Systems   Review of Systems  Constitutional: Negative for chills and fever.  HENT: Negative for sore throat.   Eyes: Negative for visual disturbance.  Respiratory: Negative for cough and shortness of breath.   Cardiovascular: Negative for chest pain.  Gastrointestinal: Positive for abdominal pain, diarrhea, nausea and vomiting. Negative for constipation, hematemesis, hematochezia and melena.  Genitourinary: Negative for dysuria and hematuria.   Musculoskeletal: Negative for neck pain.  Skin: Negative for rash.  Neurological: Negative for headaches.    Physical Exam Updated Vital Signs BP 112/61 (BP Location: Right Arm)   Pulse 99   Temp 98.3 F (36.8 C) (Oral)   Resp 16   Ht 5\' 8"  (1.727 m)   Wt (!) 149.7 kg   SpO2 100%   BMI 50.18 kg/m   Physical Exam Vitals and nursing note reviewed.  Constitutional:      Appearance: He is well-developed. He is obese.  HENT:     Head: Normocephalic and atraumatic.  Eyes:     Conjunctiva/sclera: Conjunctivae normal.  Cardiovascular:     Rate and Rhythm: Normal rate and regular rhythm.     Heart sounds: No murmur heard.   Pulmonary:     Effort: Pulmonary effort is normal. No respiratory distress.     Breath sounds: Normal breath sounds.  Abdominal:     Palpations: Abdomen is soft.     Tenderness: There is generalized abdominal tenderness. There is no guarding or rebound.  Musculoskeletal:        General: No deformity or signs of injury. Normal range of motion.     Cervical back: Neck supple.  Skin:    General: Skin is warm and dry.  Neurological:     General: No focal deficit present.     Mental Status: He is alert.     ED Results / Procedures / Treatments   Labs (all labs ordered are listed, but only abnormal results are displayed) Labs Reviewed  COMPREHENSIVE METABOLIC PANEL - Abnormal; Notable for the following components:      Result Value   Glucose, Bld 113 (*)    Calcium 8.3 (*)    Total Protein 6.4 (*)    Albumin 3.4 (*)    All other components within normal limits  LIPASE, BLOOD  CBC  URINALYSIS, ROUTINE W REFLEX MICROSCOPIC    EKG None  Radiology CT Abdomen Pelvis W Contrast  Result Date: 01/11/2020 CLINICAL DATA:  Generalized abdominal pain. EXAM: CT ABDOMEN AND PELVIS WITH CONTRAST TECHNIQUE: Multidetector CT imaging of the abdomen and pelvis was performed using the standard protocol following bolus administration of intravenous contrast.  CONTRAST:  100 mL of Omnipaque 300 intravenously. COMPARISON:  August 31, 2018. FINDINGS: Lower chest: No acute abnormality. Hepatobiliary: No focal liver abnormality is seen. No gallstones, gallbladder wall thickening, or biliary dilatation. Pancreas: Unremarkable. No pancreatic ductal dilatation or surrounding inflammatory changes. Spleen: Moderate splenomegaly is noted without focal abnormality. Adrenals/Urinary Tract: Adrenal glands are unremarkable. Kidneys are normal, without renal calculi, focal lesion, or hydronephrosis. Bladder is unremarkable. Stomach/Bowel: Stomach is within normal limits. Appendix appears normal. No evidence of bowel wall thickening, distention, or inflammatory changes. Vascular/Lymphatic: No significant vascular findings are present. No enlarged abdominal or pelvic lymph nodes. Reproductive: Prostate is unremarkable. Other: No abdominal wall hernia or abnormality. No abdominopelvic ascites. Musculoskeletal: No acute or significant osseous findings.  IMPRESSION: Moderate splenomegaly. No other abnormality seen in the abdomen or pelvis. Electronically Signed   By: Lupita Raider M.D.   On: 01/11/2020 20:37    Procedures Procedures (including critical care time)  Medications Ordered in ED Medications  morphine 4 MG/ML injection 4 mg (has no administration in time range)  ondansetron (ZOFRAN) injection 4 mg (has no administration in time range)  sodium chloride 0.9 % bolus 1,000 mL (has no administration in time range)    ED Course  I have reviewed the triage vital signs and the nursing notes.  Pertinent labs & imaging results that were available during my care of the patient were reviewed by me and considered in my medical decision making (see chart for details).  Clinical Course as of Jan 12 1104  Tue Jan 11, 2020  2222 Patient is eaten and drank here. States he is always low blood pressures. Feels well enough to be discharged.   [MB]    Clinical Course User  Index [MB] Terrilee Files, MD   MDM Rules/Calculators/A&P                         This patient complains of generalized abdominal pain nausea vomiting diarrhea; this involves an extensive number of treatment Options and is a complaint that carries with it a high risk of complications and Morbidity. The differential includes gastroenteritis, cholelithiasis, cholecystitis, diverticulitis, colitis, obstruction  I ordered, reviewed and interpreted labs, which included CBC with normal white count normal hemoglobin, chemistries normal other than mildly elevated glucose and low calcium, normal LFTs, unremarkable urine, normal lipase I ordered medication IV fluids and pain medication, nausea medication and IV Pepcid I ordered imaging studies which included CT abdomen and pelvis and I independently    visualized and interpreted imaging which showed no acute findings, radiology does comment upon moderate splenomegaly of unclear significance Previous records obtained and reviewed in epic, no recent significant events  After the interventions stated above, I reevaluated the patient and found patient symptoms to be improved.  He is tolerating p.o. here.  I reviewed his work-up with him including the incidental finding of the splenomegaly.  He has a primary care doctor so recommended follow-up with them and they can refer as needed.  His CBC does not show any significant abnormalities so I think lymphoma leukemia less likely at this time.  Return instructions discussed.   Final Clinical Impression(s) / ED Diagnoses Final diagnoses:  Nausea vomiting and diarrhea  Generalized abdominal pain  Splenomegaly    Rx / DC Orders ED Discharge Orders         Ordered    ondansetron (ZOFRAN ODT) 4 MG disintegrating tablet  Every 8 hours PRN     Discontinue  Reprint     01/11/20 2217    loperamide (IMODIUM) 2 MG capsule  4 times daily PRN     Discontinue  Reprint     01/11/20 2217    famotidine (PEPCID) 20 MG  tablet  2 times daily     Discontinue  Reprint     01/11/20 2217           Terrilee Files, MD 01/12/20 1108

## 2020-01-11 NOTE — ED Triage Notes (Signed)
Pt reports mid abd pain, emesis and diarrhea since Friday night. 6 episode of diarrhea today.

## 2020-03-21 ENCOUNTER — Emergency Department (HOSPITAL_COMMUNITY)
Admission: EM | Admit: 2020-03-21 | Discharge: 2020-03-21 | Disposition: A | Discharge status: Home / Self Care | Payer: BC Managed Care – PPO | Attending: Emergency Medicine | Admitting: Emergency Medicine

## 2020-03-21 ENCOUNTER — Encounter (HOSPITAL_COMMUNITY): Payer: Self-pay

## 2020-03-21 ENCOUNTER — Encounter (HOSPITAL_COMMUNITY): Payer: Self-pay | Admitting: Pediatrics

## 2020-03-21 ENCOUNTER — Emergency Department (HOSPITAL_COMMUNITY)
Admission: EM | Admit: 2020-03-21 | Discharge: 2020-03-22 | Disposition: A | Discharge status: Home / Self Care | Payer: BC Managed Care – PPO | Source: Home / Self Care | Attending: Emergency Medicine | Admitting: Emergency Medicine

## 2020-03-21 ENCOUNTER — Other Ambulatory Visit: Payer: Self-pay

## 2020-03-21 DIAGNOSIS — U071 COVID-19: Secondary | ICD-10-CM

## 2020-03-21 DIAGNOSIS — E119 Type 2 diabetes mellitus without complications: Secondary | ICD-10-CM | POA: Insufficient documentation

## 2020-03-21 DIAGNOSIS — I10 Essential (primary) hypertension: Secondary | ICD-10-CM | POA: Insufficient documentation

## 2020-03-21 DIAGNOSIS — R05 Cough: Secondary | ICD-10-CM | POA: Diagnosis present

## 2020-03-21 DIAGNOSIS — R059 Cough, unspecified: Secondary | ICD-10-CM

## 2020-03-21 LAB — RESPIRATORY PANEL BY RT PCR (FLU A&B, COVID)
Influenza A by PCR: NEGATIVE
Influenza B by PCR: NEGATIVE
SARS Coronavirus 2 by RT PCR: POSITIVE — AB

## 2020-03-21 NOTE — ED Triage Notes (Signed)
C/o cough started last Sunday, stated he has a son that tested + for Covid,

## 2020-03-21 NOTE — ED Triage Notes (Signed)
patient c/o cough, SOB, and body aches. Patient states his son is Covid +. Patient states, "I just need a test."

## 2020-03-21 NOTE — ED Notes (Signed)
Pt states he does not want vitals taken

## 2020-03-22 ENCOUNTER — Telehealth: Payer: Self-pay | Admitting: Internal Medicine

## 2020-03-22 ENCOUNTER — Other Ambulatory Visit: Payer: Self-pay | Admitting: Internal Medicine

## 2020-03-22 DIAGNOSIS — U071 COVID-19: Secondary | ICD-10-CM

## 2020-03-22 DIAGNOSIS — Z6841 Body Mass Index (BMI) 40.0 and over, adult: Secondary | ICD-10-CM

## 2020-03-22 NOTE — ED Notes (Signed)
All discharge instructions reviewed with pt. Pt denies questions at this time. Discharged without issue.  

## 2020-03-22 NOTE — ED Provider Notes (Signed)
[facility] EMERGENCY DEPARTMENT Provider Note   CSN: [id number] Arrival date & time: 03/21/20  1826     History Chief Complaint  Patient presents with  . Cough    COVID+    John Koch is a 34 y.o. male.  HPI     This is a 34 year old male with history of diabetes, hypertension, obesity who presents with a cough.  Patient reports that his son tested positive for COVID-19 this weekend.  He states he started to get congestion and cough 3 days ago.  He has not had any fevers or myalgias.  He is not vaccinated.  He has maintained his sense of taste and smell.  Denies any chest pain or shortness of breath.  Past Medical History:  Diagnosis Date  . Borderline diabetes mellitus   . Diabetes mellitus without complication (HCC)   . Hypertension   . Obesity     There are no problems to display for this patient.   Past Surgical History:  Procedure Laterality Date  . WISDOM TOOTH EXTRACTION         No family history on file.  Social History   Tobacco Use  . Smoking status: Never Smoker  . Smokeless tobacco: Current User    Types: Chew  Vaping Use  . Vaping Use: Never used  Substance Use Topics  . Alcohol use: No  . Drug use: No    Home Medications Prior to Admission medications   Medication Sig Start Date End Date Taking? Authorizing Provider  benzonatate (TESSALON) 100 MG capsule Take 1 capsule (100 mg total) by mouth every 8 (eight) hours. 08/03/15   Linwood Dibbles, MD  dicyclomine (BENTYL) 20 MG tablet Take 1 tablet (20 mg total) by mouth 2 (two) times daily. 05/09/16   Marlon Pel, PA-C  doxycycline (VIBRAMYCIN) 100 MG capsule Take 1 capsule (100 mg total) by mouth 2 (two) times daily. 10/31/19   Renne Crigler, PA-C  famotidine (PEPCID) 20 MG tablet Take 1 tablet (20 mg total) by mouth 2 (two) times daily. 01/11/20   Terrilee Files, MD  loperamide (IMODIUM) 2 MG capsule Take 1 capsule (2 mg total) by mouth 4 (four) times daily as needed for  diarrhea or loose stools. 01/11/20   Terrilee Files, MD  mupirocin nasal ointment Idelle Jo) 2 % Apply in each nostril daily 10/31/19   Renne Crigler, PA-C  naproxen (NAPROSYN) 500 MG tablet Take 1 tablet (500 mg total) by mouth 2 (two) times daily. 08/03/15   Linwood Dibbles, MD  ondansetron (ZOFRAN ODT) 4 MG disintegrating tablet Take 1 tablet (4 mg total) by mouth every 8 (eight) hours as needed for nausea or vomiting. 01/11/20   Terrilee Files, MD  Pseudoeph-Doxylamine-DM-APAP (NYQUIL D COLD/FLU PO) Take 30 mLs by mouth at bedtime as needed (sleep, cold/flu symptoms).    [provider]  Pseudoephedrine-APAP-DM (DAYQUIL MULTI-SYMPTOM PO) Take 2 capsules by mouth every 6 (six) hours as needed (cold symptoms).    [provider]    Allergies    Patient has no known allergies.  Review of Systems   Review of Systems  Constitutional: Negative for fever.  HENT: Positive for congestion.   Respiratory: Positive for cough.   Cardiovascular: Negative for chest pain.  Gastrointestinal: Negative for abdominal pain, nausea and vomiting.  All other systems reviewed and are negative.   Physical Exam Updated Vital Signs BP (!) 137/98 (BP Location: Right Arm)   Pulse 97   Temp 100.1 F (37.8  C) (Oral)   Resp 20   SpO2 98%   Physical Exam Vitals and nursing note reviewed.  Constitutional:      Appearance: He is well-developed. He is obese.  HENT:     Head: Normocephalic and atraumatic.     Nose: Congestion present.  Eyes:     Pupils: Pupils are equal, round, and reactive to light.  Cardiovascular:     Rate and Rhythm: Normal rate and regular rhythm.     Heart sounds: Normal heart sounds. No murmur heard.   Pulmonary:     Effort: Pulmonary effort is normal. No respiratory distress.     Breath sounds: Normal breath sounds. No wheezing.  Abdominal:     General: Bowel sounds are normal.     Palpations: Abdomen is soft.     Tenderness: There is no abdominal tenderness.  There is no rebound.  Musculoskeletal:     Cervical back: Neck supple.     Right lower leg: No edema.     Left lower leg: No edema.  Lymphadenopathy:     Cervical: No cervical adenopathy.  Skin:    General: Skin is warm and dry.  Neurological:     Mental Status: He is alert and oriented to person, place, and time.  Psychiatric:        Mood and Affect: Mood normal.     ED Results / Procedures / Treatments   Labs (all labs ordered are listed, but only abnormal results are displayed) Labs Reviewed  RESPIRATORY PANEL BY RT PCR (FLU A&B, COVID) - Abnormal; Notable for the following components:      Result Value   SARS Coronavirus 2 by RT PCR POSITIVE (*)    All other components within normal limits    EKG None  Radiology No results found.  Procedures Procedures (including critical care time)  Medications Ordered in ED Medications - No data to display  ED Course  I have reviewed the triage vital signs and the nursing notes.  Pertinent labs & imaging results that were available during my care of the patient were reviewed by me and considered in my medical decision making (see chart for details).    MDM Rules/Calculators/A&P                          Patient presents with cough and congestion.  Known positive contact for COVID-19.  Nontoxic-appearing.  Vital signs notable for temperature of 100.1.  O2 sats 98% and in no respiratory distress.  Lung sounds are clear at this time.  He is unvaccinated.  He has a history of obesity and diabetes which makes him a candidate for monoclonal antibodies.  Given that he is clinically very stable, feel he can receive these as an outpatient.  He was referred by message to the infusion clinic and was given their number.  We discussed quarantine and reasons for return.  After history, exam, and medical workup I feel the patient has been appropriately medically screened and is safe for discharge home. Pertinent diagnoses were discussed with  the patient. Patient was given return precautions.  Final Clinical Impression(s) / ED Diagnoses Final diagnoses:  Cough  COVID-19    Rx / DC Orders ED Discharge Orders    None       Mylan Lengyel, Mayer Masker, MD 03/22/20 0401

## 2020-03-22 NOTE — Telephone Encounter (Signed)
Attempt to call the patient twice, unable to leave a message. Called the patient's wife, left a message, advised Adric's test was positive, offered a infusion. Encouraged to call us back

## 2020-03-22 NOTE — Progress Notes (Signed)
I connected by phone with John Koch on 03/22/2020 at 11:11 AM to discuss the potential use of a new treatment for mild to moderate COVID-19 viral infection in non-hospitalized patients.  This patient is a 34 y.o. male that meets the FDA criteria for Emergency Use Authorization of COVID monoclonal antibody casirivimab/imdevimab or bamlanivimab/eteseviamb.  Has a (+) direct SARS-CoV-2 viral test result  Has mild or moderate COVID-19   Is NOT hospitalized due to COVID-19  Is within 10 days of symptom onset  Has at least one of the high risk factor(s) for progression to severe COVID-19 and/or hospitalization as defined in EUA.  Specific high risk criteria : BMI > 25   I have spoken and communicated the following to the patient or parent/caregiver regarding COVID monoclonal antibody treatment:  1. FDA has authorized the emergency use for the treatment of mild to moderate COVID-19 in adults and pediatric patients with positive results of direct SARS-CoV-2 viral testing who are 82 years of age and older weighing at least 40 kg, and who are at high risk for progressing to severe COVID-19 and/or hospitalization.  2. The significant known and potential risks and benefits of COVID monoclonal antibody, and the extent to which such potential risks and benefits are unknown.  3. Information on available alternative treatments and the risks and benefits of those alternatives, including clinical trials.  4. Patients treated with COVID monoclonal antibody should continue to self-isolate and use infection control measures (e.g., wear mask, isolate, social distance, avoid sharing personal items, clean and disinfect "high touch" surfaces, and frequent handwashing) according to CDC guidelines.   5. The patient or parent/caregiver has the option to accept or refuse COVID monoclonal antibody treatment.  After reviewing this information with the patient, the patient has agreed to receive one of the available  covid 19 monoclonal antibodies and will be provided an appropriate fact sheet prior to infusion. Willow Ora, MD 03/22/2020 11:11 AM

## 2020-03-22 NOTE — Telephone Encounter (Signed)
Patient called back, schedule infusion for tomorrow at 8:30 AM

## 2020-03-22 NOTE — Discharge Instructions (Addendum)
You tested positive for COVID 19.  You qualify for MAB infusion.  I have referred you.  Quarantine for the next 14 days.  If you develop worsening symptoms, you should be reevaluated.

## 2020-03-23 ENCOUNTER — Ambulatory Visit (HOSPITAL_COMMUNITY)
Admission: RE | Admit: 2020-03-23 | Discharge: 2020-03-23 | Disposition: A | Discharge status: Home / Self Care | Payer: BC Managed Care – PPO | Source: Ambulatory Visit | Attending: Pulmonary Disease | Admitting: Pulmonary Disease

## 2020-03-23 DIAGNOSIS — Z6841 Body Mass Index (BMI) 40.0 and over, adult: Secondary | ICD-10-CM | POA: Diagnosis present

## 2020-03-23 DIAGNOSIS — U071 COVID-19: Secondary | ICD-10-CM | POA: Diagnosis not present

## 2020-03-23 MED ORDER — SODIUM CHLORIDE 0.9 % IV SOLN: INTRAVENOUS | Status: DC | PRN

## 2020-03-23 MED ORDER — DIPHENHYDRAMINE HCL 50 MG/ML IJ SOLN: 50.0000 mg | Freq: Once | INTRAMUSCULAR | Status: DC | PRN

## 2020-03-23 MED ORDER — ALBUTEROL SULFATE HFA 108 (90 BASE) MCG/ACT IN AERS: 2.0000 | INHALATION_SPRAY | Freq: Once | RESPIRATORY_TRACT | Status: DC | PRN

## 2020-03-23 MED ORDER — METHYLPREDNISOLONE SODIUM SUCC 125 MG IJ SOLR: 125.0000 mg | Freq: Once | INTRAMUSCULAR | Status: DC | PRN

## 2020-03-23 MED ORDER — SODIUM CHLORIDE 0.9 % IV SOLN: 1200.0000 mg | Freq: Once | INTRAVENOUS | Status: AC

## 2020-03-23 MED ORDER — FAMOTIDINE IN NACL 20-0.9 MG/50ML-% IV SOLN: 20.0000 mg | Freq: Once | INTRAVENOUS | Status: DC | PRN

## 2020-03-23 MED ORDER — EPINEPHRINE 0.3 MG/0.3ML IJ SOAJ: 0.3000 mg | Freq: Once | INTRAMUSCULAR | Status: DC | PRN

## 2020-03-23 NOTE — Discharge Instructions (Signed)

## 2020-03-23 NOTE — Progress Notes (Signed)
  Diagnosis: COVID-19  Physician: Dr. Wright  Procedure: [facility] Infusion Clinic Med: casirivimab\imdevimab infusion - Provided patient with casirivimab\imdevimab fact sheet for patients, parents and caregivers prior to infusion.  Complications: No immediate complications noted.  Discharge: Discharged home   John Koch J 03/23/2020  

## 2020-06-17 ENCOUNTER — Encounter (HOSPITAL_COMMUNITY): Payer: Self-pay

## 2020-06-17 ENCOUNTER — Emergency Department (HOSPITAL_COMMUNITY)
Admission: EM | Admit: 2020-06-17 | Discharge: 2020-06-17 | Disposition: A | Discharge status: Home / Self Care | Payer: BC Managed Care – PPO | Attending: Emergency Medicine | Admitting: Emergency Medicine

## 2020-06-17 ENCOUNTER — Other Ambulatory Visit: Payer: Self-pay

## 2020-06-17 DIAGNOSIS — F17220 Nicotine dependence, chewing tobacco, uncomplicated: Secondary | ICD-10-CM | POA: Diagnosis not present

## 2020-06-17 DIAGNOSIS — Z79899 Other long term (current) drug therapy: Secondary | ICD-10-CM | POA: Insufficient documentation

## 2020-06-17 DIAGNOSIS — E119 Type 2 diabetes mellitus without complications: Secondary | ICD-10-CM | POA: Insufficient documentation

## 2020-06-17 DIAGNOSIS — H00012 Hordeolum externum right lower eyelid: Secondary | ICD-10-CM | POA: Diagnosis not present

## 2020-06-17 DIAGNOSIS — I10 Essential (primary) hypertension: Secondary | ICD-10-CM | POA: Insufficient documentation

## 2020-06-17 DIAGNOSIS — H5712 Ocular pain, left eye: Secondary | ICD-10-CM | POA: Diagnosis present

## 2020-06-17 DIAGNOSIS — Z7984 Long term (current) use of oral hypoglycemic drugs: Secondary | ICD-10-CM | POA: Insufficient documentation

## 2020-06-17 MED ORDER — TETRACAINE HCL 0.5 % OP SOLN: 2.0000 [drp] | Freq: Once | OPHTHALMIC | Status: DC

## 2020-06-17 MED ORDER — ERYTHROMYCIN 5 MG/GM OP OINT
1.0000 "application " | TOPICAL_OINTMENT | Freq: Once | OPHTHALMIC | Status: AC
  Administered 2020-06-17: 1 via OPHTHALMIC
  Filled 2020-06-17: qty 3.5

## 2020-06-17 MED ORDER — FLUORESCEIN SODIUM 1 MG OP STRP: 1.0000 | ORAL_STRIP | Freq: Once | OPHTHALMIC | Status: DC

## 2020-06-17 MED ORDER — ERYTHROMYCIN 5 MG/GM OP OINT: TOPICAL_OINTMENT | OPHTHALMIC | 0 refills | Status: AC

## 2020-06-17 NOTE — Discharge Instructions (Addendum)
I recommend a combination of tylenol and ibuprofen for management of your pain. You can take a low dose of both at the same time. I recommend 325 mg of Tylenol combined with 400 mg of ibuprofen. This is one regular Tylenol and two regular ibuprofen. You can take these 2-3 times for day for your pain. Please try to take these medications with a small amount of food as well to prevent upsetting your stomach.  I am prescribing you a topical erythromycin ointment.  You can apply this to the right eye.  This will hopefully help with your stye.  Please continue to apply warm compresses.  Avoid trying to pop the stye.  Please return to the ER with any new or worsening symptoms.  It was a pleasure to meet you.
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
## 2021-02-28 ENCOUNTER — Other Ambulatory Visit: Payer: Self-pay

## 2021-02-28 ENCOUNTER — Encounter (HOSPITAL_COMMUNITY): Payer: Self-pay | Admitting: *Deleted

## 2021-02-28 ENCOUNTER — Emergency Department (HOSPITAL_COMMUNITY)
Admission: EM | Admit: 2021-02-28 | Discharge: 2021-02-28 | Disposition: A | Discharge status: Home / Self Care | Payer: BC Managed Care – PPO | Attending: Emergency Medicine | Admitting: Emergency Medicine

## 2021-02-28 DIAGNOSIS — I10 Essential (primary) hypertension: Secondary | ICD-10-CM | POA: Diagnosis not present

## 2021-02-28 DIAGNOSIS — F17220 Nicotine dependence, chewing tobacco, uncomplicated: Secondary | ICD-10-CM | POA: Insufficient documentation

## 2021-02-28 DIAGNOSIS — H6092 Unspecified otitis externa, left ear: Secondary | ICD-10-CM | POA: Insufficient documentation

## 2021-02-28 DIAGNOSIS — Z79899 Other long term (current) drug therapy: Secondary | ICD-10-CM | POA: Diagnosis not present

## 2021-02-28 DIAGNOSIS — E119 Type 2 diabetes mellitus without complications: Secondary | ICD-10-CM | POA: Diagnosis not present

## 2021-02-28 DIAGNOSIS — H60502 Unspecified acute noninfective otitis externa, left ear: Secondary | ICD-10-CM

## 2021-02-28 DIAGNOSIS — H9202 Otalgia, left ear: Secondary | ICD-10-CM | POA: Diagnosis present

## 2021-02-28 DIAGNOSIS — Z7984 Long term (current) use of oral hypoglycemic drugs: Secondary | ICD-10-CM | POA: Diagnosis not present

## 2021-02-28 MED ORDER — AMOXICILLIN 500 MG PO CAPS: 500.0000 mg | ORAL_CAPSULE | Freq: Three times a day (TID) | ORAL | 0 refills | Status: AC

## 2021-02-28 MED ORDER — NEOMYCIN-POLYMYXIN-HC 3.5-10000-1 OT SUSP: 4.0000 [drp] | Freq: Three times a day (TID) | OTIC | 0 refills | Status: AC

## 2021-02-28 NOTE — ED Provider Notes (Signed)
[facility] EMERGENCY DEPARTMENT Provider Note   CSN: [id number] Arrival date & time: 02/28/21  2047     History Chief Complaint  Patient presents with   Ear Fullness    John Koch is a 35 y.o. male presenting for evaluation of left ear pain and drainage.  Symptoms began yesterday.  Pain has been constant, not improved with Tylenol or ibuprofen.  Pain radiates to his teeth.  No fever, chills, nasal congestion, sore throat, cough.  No other medical problems.  HPI     Past Medical History:  Diagnosis Date   Borderline diabetes mellitus    Diabetes mellitus without complication (HCC)    Hypertension    Obesity     There are no problems to display for this patient.   Past Surgical History:  Procedure Laterality Date   WISDOM TOOTH EXTRACTION         No family history on file.  Social History   Tobacco Use   Smoking status: Never   Smokeless tobacco: Current    Types: Chew  Vaping Use   Vaping Use: Never used  Substance Use Topics   Alcohol use: No   Drug use: No    Home Medications Prior to Admission medications   Medication Sig Start Date End Date Taking? Authorizing Provider  amoxicillin (AMOXIL) 500 MG capsule Take 1 capsule (500 mg total) by mouth 3 (three) times daily. 02/28/21  Yes Cecilio Ohlrich, PA-C  neomycin-polymyxin-hydrocortisone (CORTISPORIN) 3.5-10000-1 OTIC suspension Place 4 drops into the left ear 3 (three) times daily for 5 days. 02/28/21 03/05/21 Yes Tiwanda Threats, PA-C  erythromycin ophthalmic ointment Place a 1/2 inch ribbon of ointment into the lower eyelid. 06/17/20   Placido Sou, PA-C  glipiZIDE (GLUCOTROL) 10 MG tablet Take 10 mg by mouth 2 (two) times daily. 05/27/20   [provider]  lisinopril (ZESTRIL) 2.5 MG tablet Take 2.5 mg by mouth daily. 05/27/20   [provider]    Allergies    Patient has no known allergies.  Review of Systems   Review of Systems  Constitutional:   Negative for chills.  HENT:  Positive for ear discharge and ear pain.    Physical Exam Updated Vital Signs BP 127/73   Pulse 88   Temp 99.4 F (37.4 C) (Oral)   Resp (!) 22   Ht 5\' 8"  (1.727 m)   Wt (!) 163.3 kg   SpO2 99%   BMI 54.74 kg/m   Physical Exam Vitals and nursing note reviewed.  Constitutional:      General: He is not in acute distress.    Appearance: He is well-developed.  HENT:     Head: Normocephalic and atraumatic.     Ears:     Comments: Right TM unable to be fully visualized due to cerumen. Left ear canal edematous with thick white discharge.  Unable to visualize TM due to drainage of the canal. Eyes:     Extraocular Movements: Extraocular movements intact.  Cardiovascular:     Rate and Rhythm: Normal rate and regular rhythm.  Pulmonary:     Effort: Pulmonary effort is normal.     Breath sounds: Normal breath sounds.  Abdominal:     General: There is no distension.  Musculoskeletal:        General: Normal range of motion.     Cervical back: Normal range of motion.  Skin:    General: Skin is warm.     Findings: No rash.  Neurological:  Mental Status: He is alert and oriented to person, place, and time.    ED Results / Procedures / Treatments   Labs (all labs ordered are listed, but only abnormal results are displayed) Labs Reviewed - No data to display  EKG None  Radiology No results found.  Procedures Procedures   Medications Ordered in ED Medications - No data to display  ED Course  I have reviewed the triage vital signs and the nursing notes.  Pertinent labs & imaging results that were available during my care of the patient were reviewed by me and considered in my medical decision making (see chart for details).    MDM Rules/Calculators/A&P                           Patient presenting for evaluation of left ear pain and drainage.  On exam, patient peers nontoxic.  He has otitis externa of the left ear, unable to visualize  TM due to edema.  As such, will cover for AOM and otitis externa.  Vital signs are reassuring, patient is not septic.  No pain behind the ear to indicate mastoiditis.  Discussed treatment with antibiotic drops and pills and close follow-up with PCP.  At this time, patient appears safe for discharge.  Return precautions given.  Patient states he understands and agrees to plan  Final Clinical Impression(s) / ED Diagnoses Final diagnoses:  Acute otitis externa of left ear, unspecified type    Rx / DC Orders ED Discharge Orders          Ordered    neomycin-polymyxin-hydrocortisone (CORTISPORIN) 3.5-10000-1 OTIC suspension  3 times daily        02/28/21 2255    amoxicillin (AMOXIL) 500 MG capsule  3 times daily        02/28/21 2255             Alveria Apley, PA-C 02/28/21 2259    Wynetta Fines, MD 03/02/21 1438

## 2021-02-28 NOTE — Discharge Instructions (Signed)
Use eardrops and antibiotics as prescribed. Continue using Tylenol or ibuprofen as needed for pain. Follow with your primary care doctor if your symptoms are not improving. Return to the emergency room with any new, worsening, concerning symptoms

## 2021-02-28 NOTE — ED Triage Notes (Signed)
The pt is c/o lt ear pain since yesterday   some drainage from his ear

## 2021-04-05 ENCOUNTER — Emergency Department (HOSPITAL_COMMUNITY)
Admission: EM | Admit: 2021-04-05 | Discharge: 2021-04-06 | Disposition: A | Discharge status: Home / Self Care | Payer: BC Managed Care – PPO | Attending: Emergency Medicine | Admitting: Emergency Medicine

## 2021-04-05 ENCOUNTER — Other Ambulatory Visit: Payer: Self-pay

## 2021-04-05 ENCOUNTER — Emergency Department (HOSPITAL_COMMUNITY): Payer: BC Managed Care – PPO

## 2021-04-05 DIAGNOSIS — F17220 Nicotine dependence, chewing tobacco, uncomplicated: Secondary | ICD-10-CM | POA: Insufficient documentation

## 2021-04-05 DIAGNOSIS — Z79899 Other long term (current) drug therapy: Secondary | ICD-10-CM | POA: Insufficient documentation

## 2021-04-05 DIAGNOSIS — E119 Type 2 diabetes mellitus without complications: Secondary | ICD-10-CM | POA: Diagnosis not present

## 2021-04-05 DIAGNOSIS — B349 Viral infection, unspecified: Secondary | ICD-10-CM | POA: Diagnosis not present

## 2021-04-05 DIAGNOSIS — I10 Essential (primary) hypertension: Secondary | ICD-10-CM | POA: Diagnosis not present

## 2021-04-05 DIAGNOSIS — Z7984 Long term (current) use of oral hypoglycemic drugs: Secondary | ICD-10-CM | POA: Insufficient documentation

## 2021-04-05 DIAGNOSIS — R0602 Shortness of breath: Secondary | ICD-10-CM

## 2021-04-05 DIAGNOSIS — Z20822 Contact with and (suspected) exposure to covid-19: Secondary | ICD-10-CM | POA: Insufficient documentation

## 2021-04-05 NOTE — ED Triage Notes (Signed)
Pt in from home d/t increased SOB. Pt stated that he's not having any CP at the moment. Pt presents as diaphoretic, but afebrile. Pt does endorse caring for a child with RSV at home. Pt also endorsing chest and nasal congestion.

## 2021-04-05 NOTE — ED Provider Notes (Signed)
Emergency Medicine Provider Triage Evaluation Note  John Koch , a 35 y.o. male  was evaluated in triage.  Pt complains of SOB.  Recent PFT's that were very poor, feeling more SOB today.  Has has congestion/cough and has been sweaty/clammy as well.  Denies any real chest pain.  Son is sick with RSV currently and being seen in ED as well.  Had covid 2021.  Review of Systems  Positive: Cough, SOB, congestion Negative: fever  Physical Exam  BP (!) 155/114 (BP Location: Left Arm)   Pulse (!) 120   Temp 97.9 F (36.6 C) (Oral)   Resp (!) 22   SpO2 98%  Gen:   Awake, no distress   Resp:  Normal effort  MSK:   Moves extremities without difficulty  Other:  Very clammy  in triage  Medical Decision Making  Medically screening exam initiated at 11:28 PM.  Appropriate orders placed.  Sabastion Hrdlicka was informed that the remainder of the evaluation will be completed by another provider, this initial triage assessment does not replace that evaluation, and the importance of remaining in the ED until their evaluation is complete.  SOB.  Has cough/congestion, son sick with RSV currently.  He sounds congested but is also very clammy/sweaty in triage.  No fever currently.  Will obtain EKG, labs, CXR, covid screen.   Garlon Hatchet, PA-C 04/05/21 2332    Geoffery Lyons, MD 04/06/21 [phone]

## 2021-04-06 LAB — BASIC METABOLIC PANEL
Anion gap: 10 (ref 5–15)
BUN: 10 mg/dL (ref 6–20)
CO2: 24 mmol/L (ref 22–32)
Calcium: 9.2 mg/dL (ref 8.9–10.3)
Chloride: 100 mmol/L (ref 98–111)
Creatinine, Ser: 0.86 mg/dL (ref 0.61–1.24)
GFR, Estimated: >60 mL/min (ref >60)
Glucose, Bld: 206 mg/dL — ABNORMAL HIGH (ref 70–99)
Potassium: 3.7 mmol/L (ref 3.5–5.1)
Sodium: 134 mmol/L — ABNORMAL LOW (ref 135–145)

## 2021-04-06 LAB — CBC WITH DIFFERENTIAL/PLATELET
Abs Immature Granulocytes: 0.05 10*3/uL (ref 0.00–0.07)
Basophils Absolute: 0 10*3/uL (ref 0.0–0.1)
Basophils Relative: 0 %
Eosinophils Absolute: 0.1 10*3/uL (ref 0.0–0.5)
Eosinophils Relative: 0 %
HCT: 42.7 % (ref 39.0–52.0)
Hemoglobin: 14.6 g/dL (ref 13.0–17.0)
Immature Granulocytes: 0 %
Lymphocytes Relative: 11 %
Lymphs Abs: 1.6 10*3/uL (ref 0.7–4.0)
MCH: 28.3 pg (ref 26.0–34.0)
MCHC: 34.2 g/dL (ref 30.0–36.0)
MCV: 82.9 fL (ref 80.0–100.0)
Monocytes Absolute: 0.9 10*3/uL (ref 0.1–1.0)
Monocytes Relative: 6 %
Neutro Abs: 11.8 10*3/uL — ABNORMAL HIGH (ref 1.7–7.7)
Neutrophils Relative %: 83 %
Platelets: 208 10*3/uL (ref 150–400)
RBC: 5.15 MIL/uL (ref 4.22–5.81)
RDW: 13 % (ref 11.5–15.5)
WBC: 14.3 10*3/uL — ABNORMAL HIGH (ref 4.0–10.5)
nRBC: 0 % (ref 0.0–0.2)

## 2021-04-06 LAB — RESP PANEL BY RT-PCR (FLU A&B, COVID) ARPGX2
Influenza A by PCR: NEGATIVE
Influenza B by PCR: NEGATIVE
SARS Coronavirus 2 by RT PCR: NEGATIVE

## 2021-04-06 LAB — TROPONIN I (HIGH SENSITIVITY)
Troponin I (High Sensitivity): 10 ng/L (ref <18)
Troponin I (High Sensitivity): 14 ng/L (ref <18)

## 2021-04-06 MED ORDER — ACETAMINOPHEN 500 MG PO TABS
1000.0000 mg | ORAL_TABLET | Freq: Once | ORAL | Status: DC
  Filled 2021-04-06: qty 2

## 2021-04-06 MED ORDER — FLUTICASONE PROPIONATE 50 MCG/ACT NA SUSP: 2.0000 | Freq: Every day | NASAL | 0 refills | Status: AC

## 2021-04-06 NOTE — Discharge Instructions (Addendum)
Your work-up today was quite reassuring.  There is no evidence of pneumonia on your chest x-ray your cardiac enzymes were normal and your lab work is without any significant abnormality.  I do recommend that you monitor your symptoms and return to the ER if you develop any fevers or starts coughing significantly.  Please follow-up with your primary care provider within the next 4 to 5 days for a recheck.  You may always return to the ER for any new or concerning symptoms.  Please use Tylenol or ibuprofen for pain.  You may use 600 mg ibuprofen every 6 hours or 1000 mg of Tylenol every 6 hours.  You may choose to alternate between the 2.  This would be most effective.  Not to exceed 4 g of Tylenol within 24 hours.  Not to exceed 3200 mg ibuprofen 24 hours.   I do recommend that you start using Flonase 2 sprays into each nostril twice daily.  Also recommend taking Zyrtec which is an over-the-counter medication gargle warm salt water drink plenty of water and take your medications as prescribed.

## 2021-04-06 NOTE — ED Provider Notes (Signed)
[facility] EMERGENCY DEPARTMENT Provider Note   CSN: [id number] Arrival date & time: 04/05/21  2313     History Chief Complaint  Patient presents with   Shortness of Breath    John Koch is a 35 y.o. male.  HPI Patient is a 35 year old male with a past medical history significant for borderline DM 2, HTN, obesity  Patient is presented to the ER today with complaints of shortness of breath over the past 2 to 3 days.  States that he is having some chest congestion and sinus congestion states mostly runny nose denies any sharp pain in his chest or chest pressure.  Denies any pain currently at all.  States he simply feels tired under the weather has been more hot and sweaty recently.  Denies any lightheadedness syncope or near syncope.  Denies any fevers at home.  He has not taken anything for his symptoms.  He came to the ER because he was concerned about pneumonia earlier infection.  He denies any urinary symptoms no abdominal pain states he has normal appetite and is eating and drinking well denies any nausea vomiting or diarrhea.  He does state that he has 2 kids who are sick 1 of whom has bronchiolitis. No other known sick contacts.  No known COVID exposure    Past Medical History:  Diagnosis Date   Borderline diabetes mellitus    Diabetes mellitus without complication (HCC)    Hypertension    Obesity     There are no problems to display for this patient.   Past Surgical History:  Procedure Laterality Date   WISDOM TOOTH EXTRACTION         No family history on file.  Social History   Tobacco Use   Smoking status: Never   Smokeless tobacco: Current    Types: Chew  Vaping Use   Vaping Use: Never used  Substance Use Topics   Alcohol use: No   Drug use: No    Home Medications Prior to Admission medications   Medication Sig Start Date End Date Taking? Authorizing Provider  fluticasone (FLONASE) 50 MCG/ACT nasal spray Place 2 sprays  into both nostrils daily for 14 days. 04/06/21 04/20/21 Yes Nathaniel Yaden S, PA  amoxicillin (AMOXIL) 500 MG capsule Take 1 capsule (500 mg total) by mouth 3 (three) times daily. 02/28/21   Caccavale, Sophia, PA-C  erythromycin ophthalmic ointment Place a 1/2 inch ribbon of ointment into the lower eyelid. 06/17/20   Placido Sou, PA-C  glipiZIDE (GLUCOTROL) 10 MG tablet Take 10 mg by mouth 2 (two) times daily. 05/27/20   [provider]  lisinopril (ZESTRIL) 2.5 MG tablet Take 2.5 mg by mouth daily. 05/27/20   [provider]    Allergies    Patient has no known allergies.  Review of Systems   Review of Systems  Constitutional:  Positive for chills and fatigue. Negative for fever.  HENT:  Positive for congestion.   Eyes:  Negative for pain.  Respiratory:  Positive for cough and shortness of breath.   Cardiovascular:  Negative for chest pain and leg swelling.  Gastrointestinal:  Negative for abdominal pain, diarrhea, nausea and vomiting.  Genitourinary:  Negative for dysuria.  Musculoskeletal:  Negative for myalgias.  Skin:  Negative for rash.  Neurological:  Negative for dizziness and headaches.   Physical Exam Updated Vital Signs BP (!) 124/97   Pulse 93   Temp 98 F (36.7 C) (Oral)   Resp 12   Ht  5\' 8"  (1.727 m)   Wt (!) 164.7 kg   SpO2 97%   BMI 55.19 kg/m   Physical Exam Vitals and nursing note reviewed.  Constitutional:      General: He is not in acute distress.    Appearance: He is not ill-appearing.     Comments: Pleasant 35 year old male in no acute distress but somewhat uncomfortable appearing and some mild diaphoresis  HENT:     Head: Normocephalic and atraumatic.     Nose: Nose normal.     Mouth/Throat:     Comments: Mildly dry oral mucosa. Eyes:     General: No scleral icterus. Cardiovascular:     Rate and Rhythm: Normal rate and regular rhythm.     Pulses: Normal pulses.     Heart sounds: Normal heart sounds.  Pulmonary:      Effort: Pulmonary effort is normal. No respiratory distress.     Breath sounds: No wheezing or rales.     Comments: Coarse lung sounds.  No focal crackles and no wheezing. Abdominal:     Palpations: Abdomen is soft.     Tenderness: There is no abdominal tenderness. There is no guarding or rebound.  Musculoskeletal:     Cervical back: Normal range of motion.     Right lower leg: No edema.     Left lower leg: No edema.     Comments: No lower extremity edema or calf tenderness.  Legs are symmetric  Skin:    General: Skin is warm and dry.     Capillary Refill: Capillary refill takes less than 2 seconds.     Comments: No cellulitis of chest abdomen or lower extremities or upper extremities.  Neurological:     Mental Status: He is alert. Mental status is at baseline.  Psychiatric:        Mood and Affect: Mood normal.        Behavior: Behavior normal.    ED Results / Procedures / Treatments   Labs (all labs ordered are listed, but only abnormal results are displayed) Labs Reviewed  CBC WITH DIFFERENTIAL/PLATELET - Abnormal; Notable for the following components:      Result Value   WBC 14.3 (*)    Neutro Abs 11.8 (*)    All other components within normal limits  BASIC METABOLIC PANEL - Abnormal; Notable for the following components:   Sodium 134 (*)    Glucose, Bld 206 (*)    All other components within normal limits  RESP PANEL BY RT-PCR (FLU A&B, COVID) ARPGX2  TROPONIN I (HIGH SENSITIVITY)  TROPONIN I (HIGH SENSITIVITY)    EKG None  Radiology DG Chest 2 View  Result Date: 04/06/2021 CLINICAL DATA:  Dyspnea EXAM: CHEST - 2 VIEW COMPARISON:  02/22/2018 FINDINGS: Lungs are well expanded, symmetric, and clear. No pneumothorax or pleural effusion. Cardiac size within normal limits. Pulmonary vascularity is normal. Osseous structures are age-appropriate. No acute bone abnormality. IMPRESSION: No active cardiopulmonary disease. Electronically Signed   By: 04/24/2018 M.D.   On:  04/06/2021 00:03    Procedures Procedures   Medications Ordered in ED Medications  acetaminophen (TYLENOL) tablet 1,000 mg (has no administration in time range)    ED Course  I have reviewed the triage vital signs and the nursing notes.  Pertinent labs & imaging results that were available during my care of the patient were reviewed by me and considered in my medical decision making (see chart for details).  Clinical Course as of 04/06/21 1122  Fri Apr 06, 2021  1047 CBC with mild leukocytosis patient is afebrile and not tachycardic on my examination.  Does have some tachycardia earlier this resolved without intervention.  He is not tachypneic dyspneic or ill-appearing on my examination.  Chest x-ray without any acute abnormality. [WF]  1048 BMP unremarkable.  COVID influenza negative.  Troponin x2 within normal limits. [WF]  1048 EKG with sinus tachycardia no significant axis deviations no ST-T wave abnormalities of note.  Apart from sinus congestion patient denies any significant symptoms at this time. [WF]    Clinical Course User Index [WF] Gailen Shelter, [place]   MDM Rules/Calculators/A&P                          Patient is a 35 year old male presents to the ER today with shortness of breath is speaking in full sentences, not tachypneic, not tachycardic and uncomfortable but not ill or toxic appearing on my examination.  Has known exposure to RSV with 2 children at home.  No fevers  Has not taken any medications for his symptoms.  Was tachycardic on arrival to the ER but has not been tachycardic in the past 3 hours.  Seems that his tachycardia resolved without intervention.  Will provide patient with Tylenol  Troponin x2 within normal limits.  Cardiac work-up reassuring mild leukocytosis but no pneumonia and low suspicion for this given very minimal coughing mostly complaining of sinus congestion.  His symptoms do not meet IDSA guidelines for antibiotic initiation for  sinusitis.  Will recommend warm compresses the head Nettie pot sinus rinses gargling warm salt water, hydration, Tylenol OTC  I personally reviewed chest x-ray agree with radiology read.  COVID influenza negative.  Return precautions given.  Patient will take Tylenol at home and begin using Flonase.  We will follow-up with PCP.  He is understanding of return precautions.  Final Clinical Impression(s) / ED Diagnoses Final diagnoses:  Shortness of breath  Viral syndrome    Rx / DC Orders ED Discharge Orders          Ordered    fluticasone (FLONASE) 50 MCG/ACT nasal spray  Daily        04/06/21 1015             Solon Augusta Somers, [place] 04/06/21 1122    Linwood Dibbles, MD 04/07/21 1504

## 2021-04-06 NOTE — ED Notes (Signed)
Pt O2 sat at 96% on RA. O2 at 2LPM initiated for comfort. Pt takes pauses when talking due to SOB. No fevers. Alert and oriented x 4. Cardiac monitoring initiated.

## 2021-04-11 ENCOUNTER — Telehealth: Payer: Self-pay | Admitting: *Deleted

## 2021-04-11 NOTE — Telephone Encounter (Signed)
[facility] called for clarification of referral.  Pt seen for shortness of breath and nothing mentioned about GI symptoms.  RNCM reached out to provider for clarification.

## 2021-06-22 DIAGNOSIS — G4733 Obstructive sleep apnea (adult) (pediatric): Secondary | ICD-10-CM | POA: Diagnosis not present

## 2021-07-04 DIAGNOSIS — G4733 Obstructive sleep apnea (adult) (pediatric): Secondary | ICD-10-CM | POA: Diagnosis not present

## 2021-07-04 DIAGNOSIS — R400 Somnolence: Secondary | ICD-10-CM | POA: Diagnosis not present

## 2021-07-04 DIAGNOSIS — R5383 Other fatigue: Secondary | ICD-10-CM | POA: Diagnosis not present

## 2021-08-03 DIAGNOSIS — G4733 Obstructive sleep apnea (adult) (pediatric): Secondary | ICD-10-CM | POA: Diagnosis not present

## 2021-08-06 DIAGNOSIS — L739 Follicular disorder, unspecified: Secondary | ICD-10-CM | POA: Diagnosis not present

## 2021-08-17 DIAGNOSIS — G4733 Obstructive sleep apnea (adult) (pediatric): Secondary | ICD-10-CM | POA: Diagnosis not present

## 2021-08-17 DIAGNOSIS — R5383 Other fatigue: Secondary | ICD-10-CM | POA: Diagnosis not present

## 2021-08-17 DIAGNOSIS — R400 Somnolence: Secondary | ICD-10-CM | POA: Diagnosis not present

## 2021-08-29 IMAGING — CT CT ABD-PELV W/ CM
2 of 4 series · 17 of 46 positions shown, 19 images · IV contrast (APPLIED)
Comparison: August 31, 2018.

CLINICAL DATA: Generalized abdominal pain.

EXAM:
CT ABDOMEN AND PELVIS WITH CONTRAST
TECHNIQUE: Multidetector CT imaging of the abdomen and pelvis was performed
using the standard protocol following bolus administration of
intravenous contrast.
CONTRAST:  100 mL of Omnipaque 300 intravenously.

[Series 3: abdomen 5.0 · axial · 0.98mm/px · z∈[-583,-98]mm · 14 of 111 slices shown, 16 images]
[im 7/111  soft-tissue]
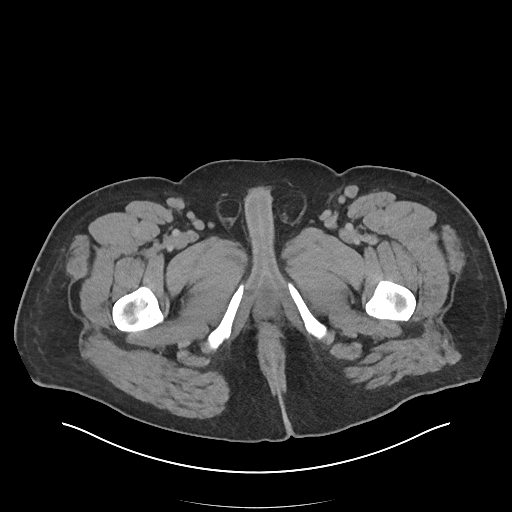
[im 7/111  bone]
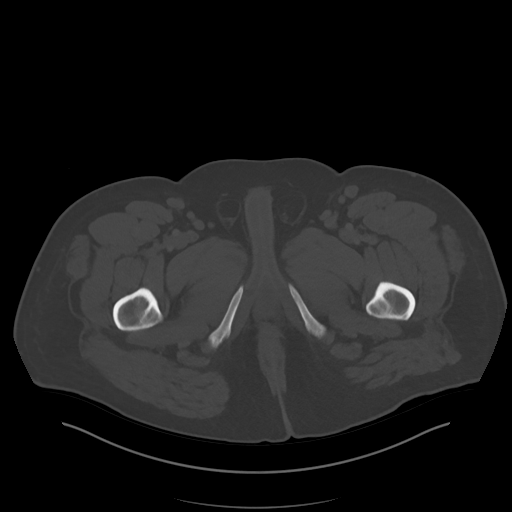
[im 13/111  soft-tissue]
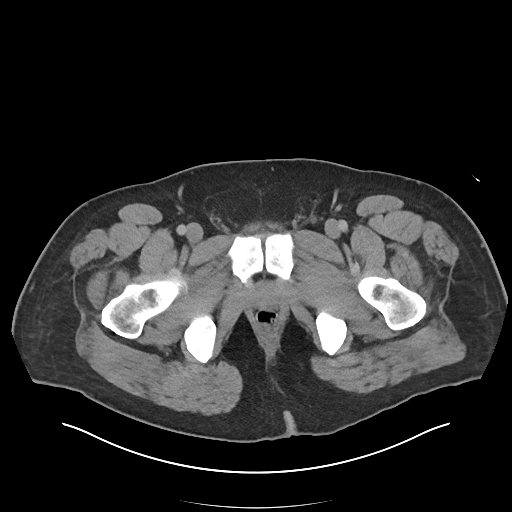
[im 25/111  soft-tissue]
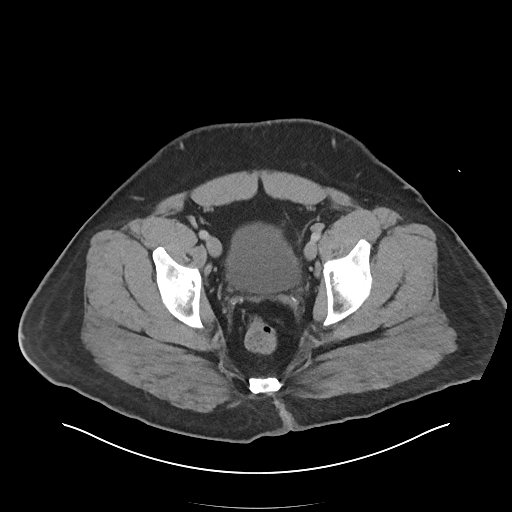
[im 31/111  soft-tissue]
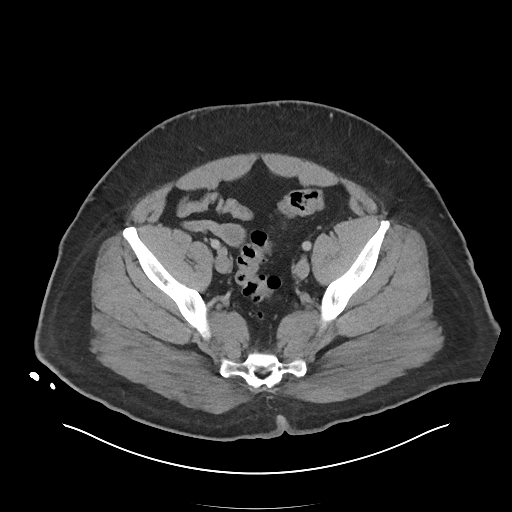
[im 37/111  soft-tissue]
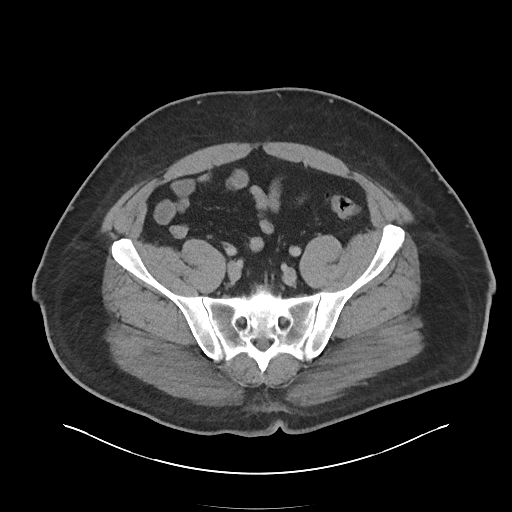
[im 43/111  soft-tissue]
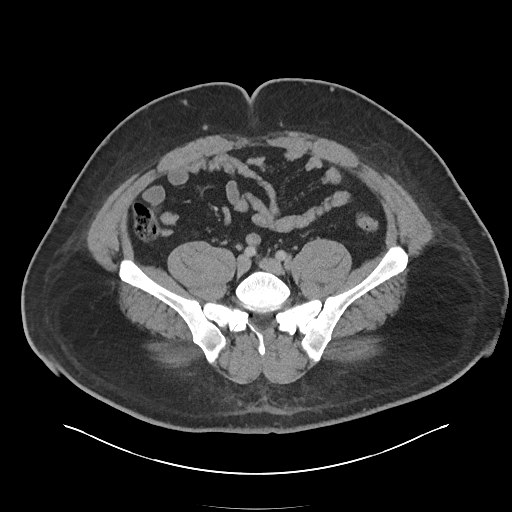
[im 49/111  soft-tissue]
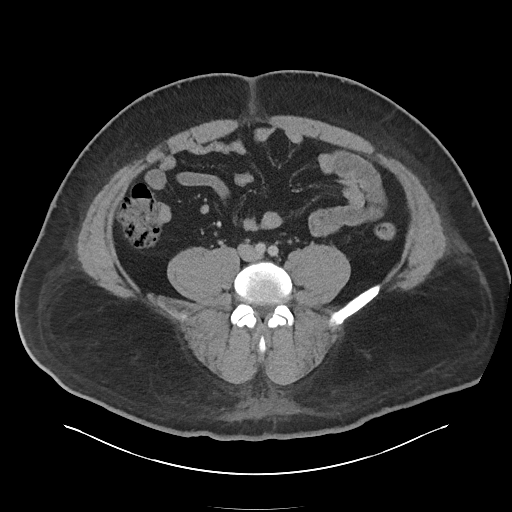
[im 62/111  soft-tissue]
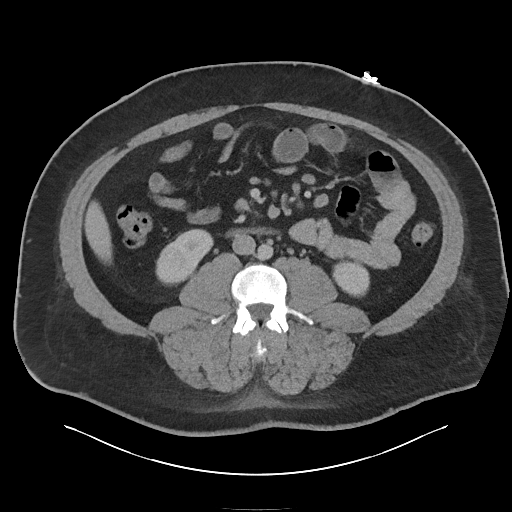
[im 68/111  soft-tissue]
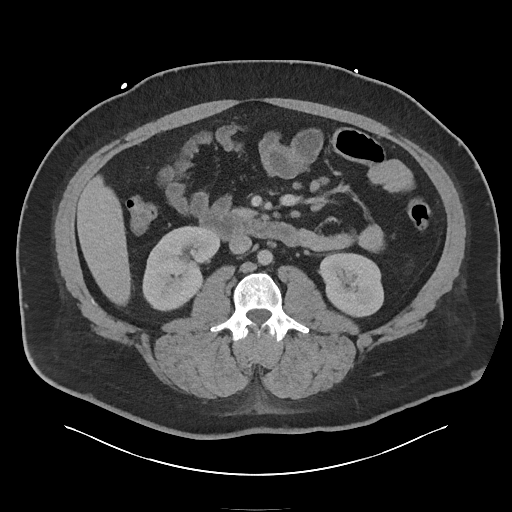
[im 68/111  bone]
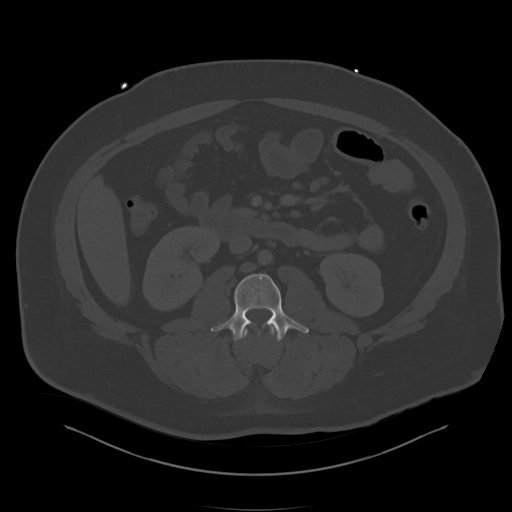
[im 74/111  soft-tissue]
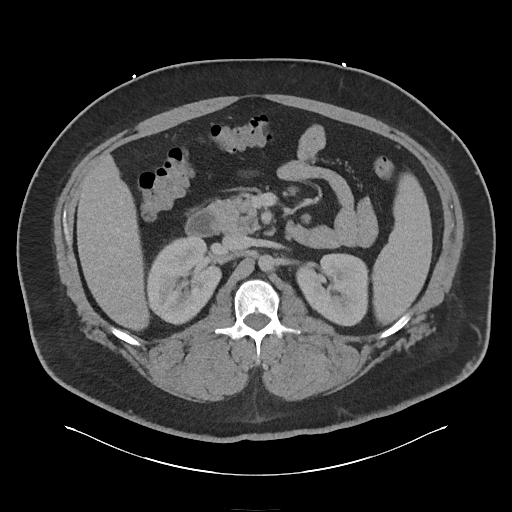
[im 80/111  soft-tissue]
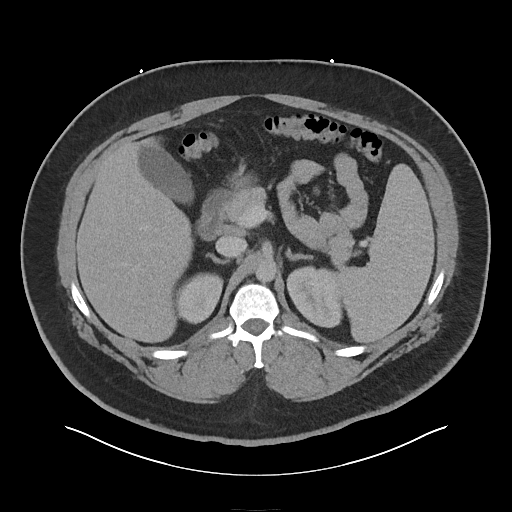
[im 86/111  soft-tissue]
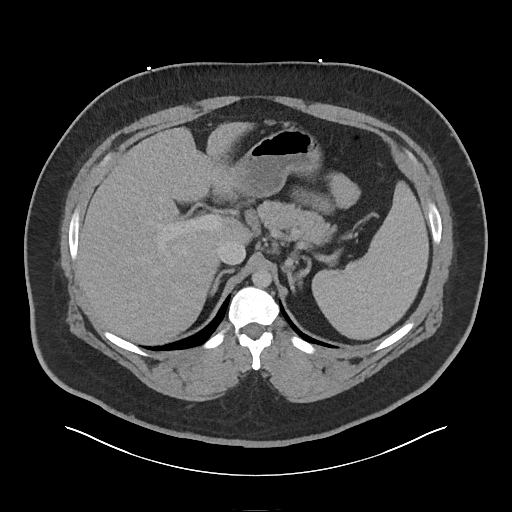
[im 98/111  soft-tissue]
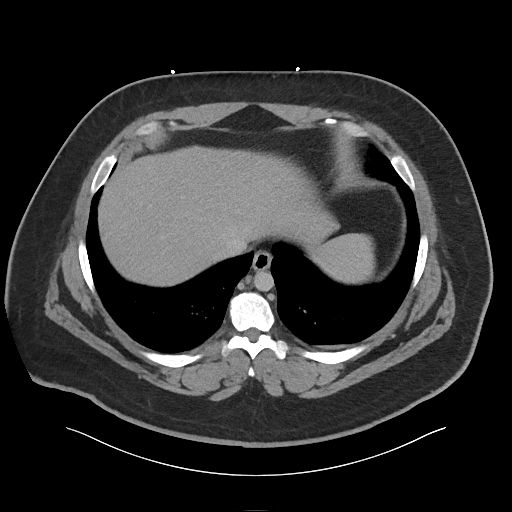
[im 104/111  soft-tissue]
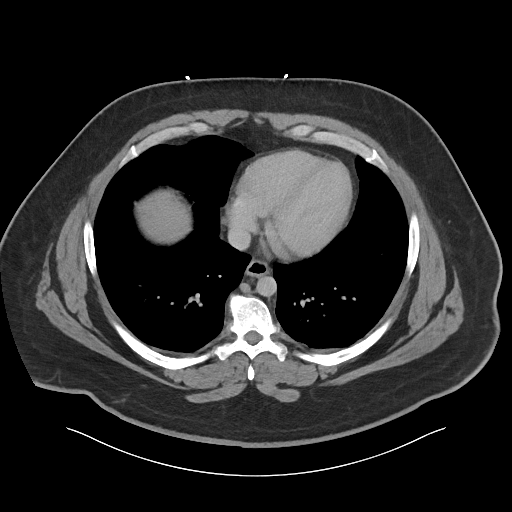

[Series 6: abdomen 3.0 mpr cor · coronal · 1.08mm/px · 3 of 142 slices shown]
[im 48/142  soft-tissue]
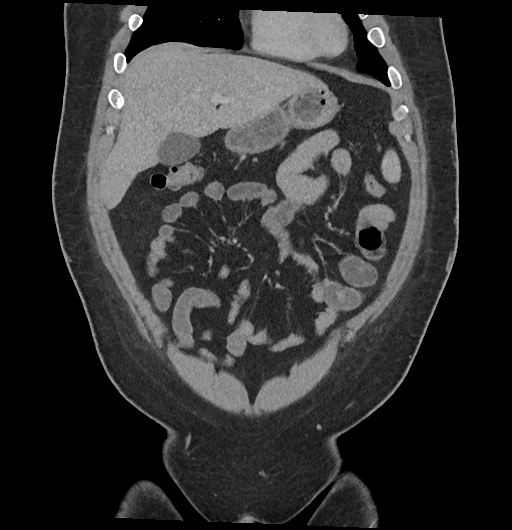
[im 63/142  soft-tissue]
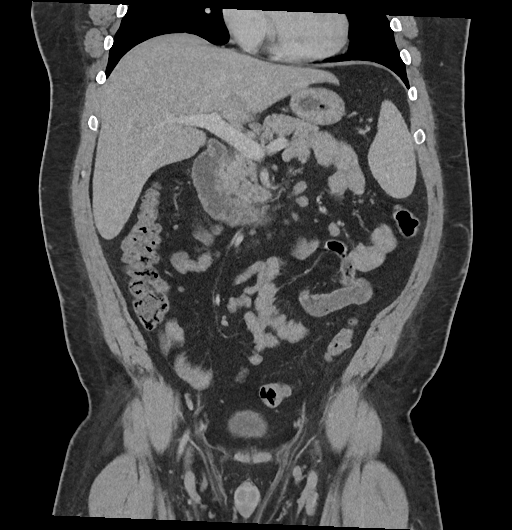
[im 79/142  soft-tissue]
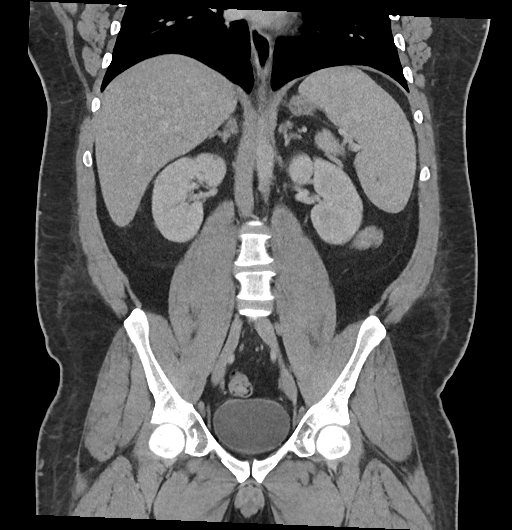

[17 of 46 positions shown; findings below may reference images not displayed]

FINDINGS: Lower chest: No acute abnormality.

Hepatobiliary: No focal liver abnormality is seen. No gallstones,
gallbladder wall thickening, or biliary dilatation.

Pancreas: Unremarkable. No pancreatic ductal dilatation or
surrounding inflammatory changes.

Spleen: Moderate splenomegaly is noted without focal abnormality.

Adrenals/Urinary Tract: Adrenal glands are unremarkable. Kidneys are
normal, without renal calculi, focal lesion, or hydronephrosis.
Bladder is unremarkable.

Stomach/Bowel: Stomach is within normal limits. Appendix appears
normal. No evidence of bowel wall thickening, distention, or
inflammatory changes.

Vascular/Lymphatic: No significant vascular findings are present. No
enlarged abdominal or pelvic lymph nodes.

Reproductive: Prostate is unremarkable.

Other: No abdominal wall hernia or abnormality. No abdominopelvic
ascites.

Musculoskeletal: No acute or significant osseous findings.
IMPRESSION: Moderate splenomegaly. No other abnormality seen in the abdomen or
pelvis.

## 2022-01-19 DIAGNOSIS — L03113 Cellulitis of right upper limb: Secondary | ICD-10-CM | POA: Diagnosis not present

## 2022-02-22 DIAGNOSIS — F321 Major depressive disorder, single episode, moderate: Secondary | ICD-10-CM | POA: Diagnosis not present

## 2022-02-22 DIAGNOSIS — Z79899 Other long term (current) drug therapy: Secondary | ICD-10-CM | POA: Diagnosis not present

## 2022-02-22 DIAGNOSIS — E119 Type 2 diabetes mellitus without complications: Secondary | ICD-10-CM | POA: Diagnosis not present

## 2022-03-15 DIAGNOSIS — G2581 Restless legs syndrome: Secondary | ICD-10-CM | POA: Diagnosis not present

## 2022-03-15 DIAGNOSIS — E1165 Type 2 diabetes mellitus with hyperglycemia: Secondary | ICD-10-CM | POA: Diagnosis not present

## 2022-04-10 DIAGNOSIS — A084 Viral intestinal infection, unspecified: Secondary | ICD-10-CM | POA: Diagnosis not present

## 2022-04-14 ENCOUNTER — Encounter (HOSPITAL_COMMUNITY): Payer: Self-pay | Admitting: Emergency Medicine

## 2022-04-14 ENCOUNTER — Emergency Department (HOSPITAL_COMMUNITY)
Admission: EM | Admit: 2022-04-14 | Discharge: 2022-04-14 | Disposition: A | Discharge status: Home / Self Care | Payer: BC Managed Care – PPO | Attending: Emergency Medicine | Admitting: Emergency Medicine

## 2022-04-14 DIAGNOSIS — R112 Nausea with vomiting, unspecified: Secondary | ICD-10-CM | POA: Diagnosis not present

## 2022-04-14 DIAGNOSIS — R197 Diarrhea, unspecified: Secondary | ICD-10-CM | POA: Diagnosis not present

## 2022-04-14 DIAGNOSIS — D72829 Elevated white blood cell count, unspecified: Secondary | ICD-10-CM | POA: Insufficient documentation

## 2022-04-14 DIAGNOSIS — I10 Essential (primary) hypertension: Secondary | ICD-10-CM | POA: Insufficient documentation

## 2022-04-14 DIAGNOSIS — Z7984 Long term (current) use of oral hypoglycemic drugs: Secondary | ICD-10-CM | POA: Insufficient documentation

## 2022-04-14 DIAGNOSIS — E119 Type 2 diabetes mellitus without complications: Secondary | ICD-10-CM | POA: Diagnosis not present

## 2022-04-14 DIAGNOSIS — Z79899 Other long term (current) drug therapy: Secondary | ICD-10-CM | POA: Insufficient documentation

## 2022-04-14 DIAGNOSIS — R1084 Generalized abdominal pain: Secondary | ICD-10-CM

## 2022-04-14 LAB — URINALYSIS, ROUTINE W REFLEX MICROSCOPIC
Bilirubin Urine: NEGATIVE
Glucose, UA: NEGATIVE mg/dL
Hgb urine dipstick: NEGATIVE
Ketones, ur: NEGATIVE mg/dL
Leukocytes,Ua: NEGATIVE
Nitrite: NEGATIVE
Protein, ur: NEGATIVE mg/dL
Specific Gravity, Urine: 1.025 (ref 1.005–1.030)
pH: 5.5 (ref 5.0–8.0)

## 2022-04-14 LAB — CBC WITH DIFFERENTIAL/PLATELET
Abs Immature Granulocytes: 0.05 10*3/uL (ref 0.00–0.07)
Basophils Absolute: 0 10*3/uL (ref 0.0–0.1)
Basophils Relative: 0 %
Eosinophils Absolute: 0.1 10*3/uL (ref 0.0–0.5)
Eosinophils Relative: 1 %
HCT: 43.4 % (ref 39.0–52.0)
Hemoglobin: 14.5 g/dL (ref 13.0–17.0)
Immature Granulocytes: 0 %
Lymphocytes Relative: 11 %
Lymphs Abs: 1.4 10*3/uL (ref 0.7–4.0)
MCH: 27.8 pg (ref 26.0–34.0)
MCHC: 33.4 g/dL (ref 30.0–36.0)
MCV: 83.3 fL (ref 80.0–100.0)
Monocytes Absolute: 0.7 10*3/uL (ref 0.1–1.0)
Monocytes Relative: 5 %
Neutro Abs: 10.3 10*3/uL — ABNORMAL HIGH (ref 1.7–7.7)
Neutrophils Relative %: 83 %
Platelets: 237 10*3/uL (ref 150–400)
RBC: 5.21 MIL/uL (ref 4.22–5.81)
RDW: 13.8 % (ref 11.5–15.5)
WBC: 12.5 10*3/uL — ABNORMAL HIGH (ref 4.0–10.5)
nRBC: 0 % (ref 0.0–0.2)

## 2022-04-14 LAB — COMPREHENSIVE METABOLIC PANEL
ALT: 28 U/L (ref 0–44)
AST: 24 U/L (ref 15–41)
Albumin: 3.5 g/dL (ref 3.5–5.0)
Alkaline Phosphatase: 75 U/L (ref 38–126)
Anion gap: 10 (ref 5–15)
BUN: 14 mg/dL (ref 6–20)
CO2: 23 mmol/L (ref 22–32)
Calcium: 9.2 mg/dL (ref 8.9–10.3)
Chloride: 102 mmol/L (ref 98–111)
Creatinine, Ser: 0.93 mg/dL (ref 0.61–1.24)
GFR, Estimated: >60 mL/min (ref >60)
Glucose, Bld: 134 mg/dL — ABNORMAL HIGH (ref 70–99)
Potassium: 5 mmol/L (ref 3.5–5.1)
Sodium: 135 mmol/L (ref 135–145)
Total Bilirubin: 0.3 mg/dL (ref 0.3–1.2)
Total Protein: 7.6 g/dL (ref 6.5–8.1)

## 2022-04-14 LAB — LIPASE, BLOOD: Lipase: 52 U/L — ABNORMAL HIGH (ref 11–51)

## 2022-04-14 MED ORDER — DIPHENOXYLATE-ATROPINE 2.5-0.025 MG PO TABS: 2.0000 | ORAL_TABLET | Freq: Four times a day (QID) | ORAL | 0 refills | Status: AC | PRN

## 2022-04-14 MED ORDER — ONDANSETRON HCL 4 MG/2ML IJ SOLN
4.0000 mg | Freq: Once | INTRAMUSCULAR | Status: AC
  Administered 2022-04-14: 4 mg via INTRAVENOUS
  Filled 2022-04-14: qty 2

## 2022-04-14 MED ORDER — ONDANSETRON 4 MG PO TBDP: 8.0000 mg | ORAL_TABLET | Freq: Once | ORAL | Status: DC

## 2022-04-14 MED ORDER — SODIUM CHLORIDE 0.9 % IV BOLUS
1000.0000 mL | Freq: Once | INTRAVENOUS | Status: AC
  Administered 2022-04-14: 1000 mL via INTRAVENOUS

## 2022-04-14 NOTE — ED Triage Notes (Signed)
Pt here from home with c/o n/v and abd pian for 3 weeks ,went to his mD on Monday was given nausea meds was feeling fine until this am

## 2022-04-14 NOTE — ED Provider Notes (Signed)
[facility] EMERGENCY DEPARTMENT Provider Note   CSN: [id number] Arrival date & time: 04/14/22  1209     History  Chief Complaint  Patient presents with   Abdominal Pain    John Koch is a 36 y.o. male with a history of diabetes and hypertension who presents the emergency department complaining of abdominal discomfort  for the past 3 to 4 weeks with vomiting and diarrhea for a week and a half.  Patient states that he started [facility] for his diabetes about a month ago.  After about 5 doses of this he started having these symptoms. Went to his doctor and they prescribed him zofran which was working up until this morning. Has had about 3-4 episodes of emesis today. Consistent loose stools. Has tried immodium without relief. No blood or dark/tarry in vomit or stool. No fever or chills. Complaining of abdominal discomfort across his entire abdomen. Pain worsened with vomiting. Family member with Chron's so his family member with him is concerned about this. No recent travel or hospitalizations. No other changes in medications,  no antibiotic use.    Abdominal Pain Associated symptoms: diarrhea, nausea and vomiting   Associated symptoms: no chest pain, no chills, no constipation, no dysuria and no fever        Home Medications Prior to Admission medications   Medication Sig Start Date End Date Taking? Authorizing Provider  diphenoxylate-atropine (LOMOTIL) 2.5-0.025 MG tablet Take 2 tablets by mouth 4 (four) times daily as needed for diarrhea or loose stools. 04/14/22  Yes Tasheema Perrone T, PA-C  amoxicillin (AMOXIL) 500 MG capsule Take 1 capsule (500 mg total) by mouth 3 (three) times daily. 02/28/21   Caccavale, Sophia, PA-C  erythromycin ophthalmic ointment Place a 1/2 inch ribbon of ointment into the lower eyelid. 06/17/20   Placido Sou, PA-C  fluticasone (FLONASE) 50 MCG/ACT nasal spray Place 2 sprays into both nostrils daily for 14 days. 04/06/21 04/20/21   Gailen Shelter, PA  glipiZIDE (GLUCOTROL) 10 MG tablet Take 10 mg by mouth 2 (two) times daily. 05/27/20   [provider]  lisinopril (ZESTRIL) 2.5 MG tablet Take 2.5 mg by mouth daily. 05/27/20   [provider]      Allergies    Patient has no known allergies.    Review of Systems   Review of Systems  Constitutional:  Negative for chills and fever.  Cardiovascular:  Negative for chest pain.  Gastrointestinal:  Positive for abdominal pain, diarrhea, nausea and vomiting. Negative for blood in stool and constipation.  Genitourinary:  Negative for dysuria.  All other systems reviewed and are negative.   Physical Exam Updated Vital Signs BP 97/71 (BP Location: Right Arm)   Pulse 88   Temp 98.4 F (36.9 C) (Oral)   Resp 18   SpO2 100%  Physical Exam Vitals and nursing note reviewed.  Constitutional:      Appearance: Normal appearance.  HENT:     Head: Normocephalic and atraumatic.  Eyes:     Conjunctiva/sclera: Conjunctivae normal.  Cardiovascular:     Rate and Rhythm: Normal rate and regular rhythm.  Pulmonary:     Effort: Pulmonary effort is normal. No respiratory distress.     Breath sounds: Normal breath sounds.  Abdominal:     General: There is no distension.     Palpations: Abdomen is soft.     Tenderness: There is generalized abdominal tenderness. There is no guarding or rebound.  Skin:    General: Skin is  warm and dry.  Neurological:     General: No focal deficit present.     Mental Status: He is alert.     ED Results / Procedures / Treatments   Labs (all labs ordered are listed, but only abnormal results are displayed) Labs Reviewed  COMPREHENSIVE METABOLIC PANEL - Abnormal; Notable for the following components:      Result Value   Glucose, Bld 134 (*)    All other components within normal limits  CBC WITH DIFFERENTIAL/PLATELET - Abnormal; Notable for the following components:   WBC 12.5 (*)    Neutro Abs 10.3 (*)    All other  components within normal limits  LIPASE, BLOOD - Abnormal; Notable for the following components:   Lipase 52 (*)    All other components within normal limits  URINALYSIS, ROUTINE W REFLEX MICROSCOPIC  CBG MONITORING, ED    EKG None  Radiology No results found.  Procedures Procedures    Medications Ordered in ED Medications  sodium chloride 0.9 % bolus 1,000 mL (0 mLs Intravenous Stopped 04/14/22 1925)  ondansetron (ZOFRAN) injection 4 mg (4 mg Intravenous Given 04/14/22 1637)    ED Course/ Medical Decision Making/ A&P                           Medical Decision Making  This patient is a 36 y.o. male  who presents to the ED for concern of abdominal pain, vomiting and diarrhea x 3 weeks.   Differential diagnoses prior to evaluation: The emergent differential diagnosis includes, but is not limited to,  ACS/MI, Boerhaave's, DKA, elevated ICP, Ischemic bowel, Sepsis, Drug-related (toxicity, THC hyperemesis, ETOH, withdrawal), Appendicitis, Bowel obstruction, Electrolyte abnormalities, Pancreatitis, Biliary colic, Gastroenteritis, Gastroparesis, Hepatitis, Migraine, Thyroid disease, Renal colic, GERD/PUD, UTI, Testicular torsion. This is not an exhaustive differential.   Past Medical History / Co-morbidities: HTN, diabetes, IBS  Additional history: Chart reviewed. Pertinent results include: Patient was seen in ER on 10/13 for URI symptoms, reassuring workup at that time.   Physical Exam: Physical exam performed. The pertinent findings include: Normal vital signs. In no acute distress. Abdomen soft with generalized tenderness, no guarding.   Lab Tests/Imaging studies: I personally interpreted labs/imaging and the pertinent results include:  Leukocytosis of 12.5, CMP unremarkable, lipase 52 (compared to 40 two years ago), urinalysis negative for hematuria or infection.   Medications: I ordered medication including IV fluids and zofran.  I have reviewed the patients home  medicines and have made adjustments as needed.   Disposition: After consideration of the diagnostic results and the patients response to treatment, I feel that emergency department workup does not suggest an emergent condition requiring admission or immediate intervention beyond what has been performed at this time. The plan is: discharge to home with prescription for lomotil and recommend follow up with GI. Suspect symptoms related to side effects of mounjaro vs recurrent IBS. Low concern for infectious etiology as he is afebrile, no white count, and otherwise appears well. The patient is safe for discharge and has been instructed to return immediately for worsening symptoms, change in symptoms or any other concerns.   Final Clinical Impression(s) / ED Diagnoses Final diagnoses:  Nausea, vomiting, and diarrhea  Generalized abdominal pain    Rx / DC Orders ED Discharge Orders          Ordered    diphenoxylate-atropine (LOMOTIL) 2.5-0.025 MG tablet  4 times daily PRN  04/14/22 1931           Portions of this report may have been transcribed using voice recognition software. Every effort was made to ensure accuracy; however, inadvertent computerized transcription errors may be present.    Estill Cotta 04/14/22 1934    Blanchie Dessert, MD 04/14/22 2311

## 2022-04-14 NOTE — ED Notes (Signed)
Patient provided with crackers and ginger-ale. Patient able to tolerate both without any complaints at this time

## 2022-04-14 NOTE — ED Provider Triage Note (Signed)
Emergency Medicine Provider Triage Evaluation Note  Zayvier Caravello , a 36 y.o. male  was evaluated in triage.  Pt complains of diffuse abdominal pain, nausea, vomiting.  Patient states that symptoms been present for the past 3 to 4 weeks.  He notes starting Mounjaro mid to late September.  He went to his PCP on Monday who gave him Zofran which helped his symptoms up until this morning.  He notes persistent vomiting since onset this morning.  Denies hematemesis, hematochezia, fever, chills, night sweats, chest pain, shortness of breath, urinary symptoms, change in bowel habits.  Denies marijuana or alcohol use.  Past medical history significant for diabetes, obesity hypertension  Review of Systems  Positive: See above Negative:   Physical Exam  BP 105/83 (BP Location: Right Arm)   Pulse 98   Temp 98.1 F (36.7 C) (Oral)   Resp 18   SpO2 91%  Gen:   Awake, no distress   Resp:  Normal effort  MSK:   Moves extremities without difficulty  Other:  Mild diffuse abdominal tenderness.  Medical Decision Making  Medically screening exam initiated at 12:48 PM.  Appropriate orders placed.  Tamarius Rosenfield was informed that the remainder of the evaluation will be completed by another provider, this initial triage assessment does not replace that evaluation, and the importance of remaining in the ED until their evaluation is complete.     Wilnette Kales, [place] 04/14/22 1250

## 2022-04-14 NOTE — Discharge Instructions (Addendum)
You were seen in the emergency department for abdominal pain, vomiting, and diarrhea.  As we discussed, your lab work was reassuring. I think your symptoms could be related to the [facility] medication or your irritable bowel syndrome.   I am prescribing you a diarrhea medicine to take, and I recommend talking to your doctor about stopping the mounjaro.   Continue to monitor how you're doing and return to the ER for new or worsening symptoms.

## 2022-04-23 DIAGNOSIS — R1013 Epigastric pain: Secondary | ICD-10-CM | POA: Diagnosis not present

## 2022-04-23 DIAGNOSIS — E1165 Type 2 diabetes mellitus with hyperglycemia: Secondary | ICD-10-CM | POA: Diagnosis not present

## 2022-04-26 DIAGNOSIS — R1013 Epigastric pain: Secondary | ICD-10-CM | POA: Diagnosis not present

## 2022-05-07 DIAGNOSIS — R161 Splenomegaly, not elsewhere classified: Secondary | ICD-10-CM | POA: Diagnosis not present

## 2022-05-07 DIAGNOSIS — R1013 Epigastric pain: Secondary | ICD-10-CM | POA: Diagnosis not present

## 2022-05-07 DIAGNOSIS — Z5321 Procedure and treatment not carried out due to patient leaving prior to being seen by health care provider: Secondary | ICD-10-CM | POA: Diagnosis not present

## 2022-05-08 DIAGNOSIS — R1013 Epigastric pain: Secondary | ICD-10-CM | POA: Diagnosis not present

## 2022-12-12 DIAGNOSIS — Z79899 Other long term (current) drug therapy: Secondary | ICD-10-CM | POA: Diagnosis not present

## 2022-12-12 DIAGNOSIS — E1165 Type 2 diabetes mellitus with hyperglycemia: Secondary | ICD-10-CM | POA: Diagnosis not present

## 2022-12-12 DIAGNOSIS — F321 Major depressive disorder, single episode, moderate: Secondary | ICD-10-CM | POA: Diagnosis not present

## 2023-03-27 ENCOUNTER — Encounter: Payer: Self-pay | Admitting: "Endocrinology

## 2023-03-27 ENCOUNTER — Ambulatory Visit: Payer: BC Managed Care – PPO | Admitting: "Endocrinology

## 2023-03-27 VITALS — BP 115/83 | HR 75 | Ht 68.0 in | Wt 329.6 lb

## 2023-03-27 DIAGNOSIS — E1165 Type 2 diabetes mellitus with hyperglycemia: Secondary | ICD-10-CM | POA: Diagnosis not present

## 2023-03-27 DIAGNOSIS — Z794 Long term (current) use of insulin: Secondary | ICD-10-CM

## 2023-03-27 DIAGNOSIS — Z7984 Long term (current) use of oral hypoglycemic drugs: Secondary | ICD-10-CM | POA: Diagnosis not present

## 2023-03-27 LAB — POCT GLYCOSYLATED HEMOGLOBIN (HGB A1C): Hemoglobin A1C: 8.9 % — AB (ref 4.0–5.6)

## 2023-03-27 LAB — LIPID PANEL
Cholesterol: 139 mg/dL (ref 0–200)
HDL: 29.8 mg/dL — ABNORMAL LOW (ref >39.00)
LDL Cholesterol: 71 mg/dL (ref 0–99)
NonHDL: 109.06
Total CHOL/HDL Ratio: 5
Triglycerides: 191 mg/dL — ABNORMAL HIGH (ref 0.0–149.0)
VLDL: 38.2 mg/dL (ref 0.0–40.0)

## 2023-03-27 LAB — COMPREHENSIVE METABOLIC PANEL
ALT: 38 U/L (ref 0–53)
AST: 25 U/L (ref 0–37)
Albumin: 4.1 g/dL (ref 3.5–5.2)
Alkaline Phosphatase: 77 U/L (ref 39–117)
BUN: 15 mg/dL (ref 6–23)
CO2: 27 meq/L (ref 19–32)
Calcium: 9 mg/dL (ref 8.4–10.5)
Chloride: 101 meq/L (ref 96–112)
Creatinine, Ser: 0.73 mg/dL (ref 0.40–1.50)
GFR: 118.35 mL/min (ref >60.00)
Glucose, Bld: 193 mg/dL — ABNORMAL HIGH (ref 70–99)
Potassium: 4.2 meq/L (ref 3.5–5.1)
Sodium: 135 meq/L (ref 135–145)
Total Bilirubin: 0.6 mg/dL (ref 0.2–1.2)
Total Protein: 6.9 g/dL (ref 6.0–8.3)

## 2023-03-27 LAB — MICROALBUMIN / CREATININE URINE RATIO
Creatinine,U: 178.3 mg/dL
Microalb Creat Ratio: 5.7 mg/g (ref 0.0–30.0)
Microalb, Ur: 10.1 mg/dL — ABNORMAL HIGH (ref 0.0–1.9)

## 2023-03-27 MED ORDER — BLOOD GLUCOSE TEST VI STRP: 1.0000 | ORAL_STRIP | Freq: Three times a day (TID) | 3 refills | Status: AC

## 2023-03-27 MED ORDER — METFORMIN HCL ER 500 MG PO TB24: 500.0000 mg | ORAL_TABLET | Freq: Two times a day (BID) | ORAL | 2 refills | Status: DC

## 2023-03-27 MED ORDER — LANCETS MISC. MISC: 1.0000 | Freq: Three times a day (TID) | 3 refills | Status: AC

## 2023-03-27 MED ORDER — LANCET DEVICE MISC: 1.0000 | Freq: Three times a day (TID) | 0 refills | Status: AC

## 2023-03-27 MED ORDER — BLOOD GLUCOSE MONITORING SUPPL DEVI: 1.0000 | Freq: Three times a day (TID) | 0 refills | Status: AC

## 2023-03-27 NOTE — Progress Notes (Signed)
Outpatient Endocrinology Note John Obion, MD  03/27/23   John Koch 1985/11/17 [id number]  Referring Provider: Nonnie Done., MD Primary Care Provider: Inc, [facility] Reason for consultation: Subjective   Assessment & Plan  Diagnoses and all orders for this visit:  Uncontrolled type 2 diabetes mellitus with hyperglycemia (HCC) -     POCT glycosylated hemoglobin (Hb A1C) -     GAD65, IA-2, and Insulin Autoantibody serum; Future -     C-peptide; Future -     Comprehensive metabolic panel; Future -     Lipid panel; Future -     Microalbumin / creatinine urine ratio; Future -     Ambulatory referral to diabetic education -     Blood Glucose Monitoring Suppl DEVI; 1 each by Does not apply route in the morning, at noon, and at bedtime. May substitute to any manufacturer covered by patient's insurance. -     Glucose Blood (BLOOD GLUCOSE TEST STRIPS) STRP; 1 each by In Vitro route in the morning, at noon, and at bedtime. May substitute to any manufacturer covered by patient's insurance. -     Lancet Device MISC; 1 each by Does not apply route in the morning, at noon, and at bedtime. May substitute to any manufacturer covered by patient's insurance. -     Lancets Misc. MISC; 1 each by Does not apply route in the morning, at noon, and at bedtime. May substitute to any manufacturer covered by patient's insurance. -     metFORMIN (GLUCOPHAGE-XR) 500 MG 24 hr tablet; Take 1 tablet (500 mg total) by mouth 2 (two) times daily with a meal.  Long term (current) use of oral hypoglycemic drugs    Diabetes Type II complicated by hyperglycemia, No results found for: "GFR" Hba1c goal less than 7, current Hba1c is  Lab Results  Component Value Date   HGBA1C 8.9 (A) 03/27/2023   Will recommend the following: Metformin XR 500 mg bid Glipizide 10 mg qd  No known contraindications/side effects to any of above medications  -Last LD and Tg are as follows: No results  found for: "LDLCALC" No results found for: "TRIG" -not on statin  -Follow low fat diet and exercise   -Blood pressure goal <140/90 - Microalbumin/creatinine goal is < 30 -Last MA/Cr is as follows: No results found for: "MICROALBUR", "MALB24HUR" -on ACE/ARB lisinopril 2.5 mg qd -diet changes including salt restriction -limit eating outside -counseled BP targets per standards of diabetes care -uncontrolled blood pressure can lead to retinopathy, nephropathy and cardiovascular and atherosclerotic heart disease  Reviewed and counseled on: -A1C target -Blood sugar targets -Complications of uncontrolled diabetes  -Checking blood sugar before meals and bedtime and bring log next visit -All medications with mechanism of action and side effects -Hypoglycemia management: rule of 15's, Glucagon Emergency Kit and medical alert ID -low-carb low-fat plate-method diet -At least 20 minutes of physical activity per day -Annual dilated retinal eye exam and foot exam -compliance and follow up needs -follow up as scheduled or earlier if problem gets worse  Call if blood sugar is less than 70 or consistently above 250    Take a 15 gm snack of carbohydrate at bedtime before you go to sleep if your blood sugar is less than 100.    If you are going to fast after midnight for a test or procedure, ask your physician for instructions on how to reduce/decrease your insulin dose.    Call if blood sugar is less than  70 or consistently above 250  -Treating a low sugar by rule of 15  (15 gms of sugar every 15 min until sugar is more than 70) If you feel your sugar is low, test your sugar to be sure If your sugar is low (less than 70), then take 15 grams of a fast acting Carbohydrate (3-4 glucose tablets or glucose gel or 4 ounces of juice or regular soda) Recheck your sugar 15 min after treating low to make sure it is more than 70 If sugar is still less than 70, treat again with 15 grams of carbohydrate           Don't drive the hour of hypoglycemia  If unconscious/unable to eat or drink by mouth, use glucagon injection or nasal spray baqsimi and call 911. Can repeat again in 15 min if still unconscious.  Return in about 2 weeks (around 04/10/2023) for at 1 pm.   I have reviewed current medications, nurse's notes, allergies, vital signs, past medical and surgical history, family medical history, and social history for this encounter. Counseled patient on symptoms, examination findings, lab findings, imaging results, treatment decisions and monitoring and prognosis. The patient understood the recommendations and agrees with the treatment plan. All questions regarding treatment plan were fully answered.  John [place], MD  03/27/23    History of Present Illness John Koch is a 37 y.o. year old male who presents for evaluation of Type II diabetes mellitus.  John Koch was first diagnosed in 2016.   Diabetes education -  Home diabetes regimen: Glipizide 10 mg qd  Tried metformin and mounjaro in the past  COMPLICATIONS -  MI/Stroke -  retinopathy -  neuropathy -  nephropathy  SYMPTOMS REVIEWED - Polyuria + Weight loss - Blurred vision  BLOOD SUGAR DATA Last check on Monday Did not bring meter   Physical Exam  BP 115/83   Pulse 75   Ht 5\' 8"  (1.727 m)   Wt (!) 329 lb 9.6 oz (149.5 kg)   SpO2 98%   BMI 50.12 kg/m    Constitutional: well developed, well nourished Head: normocephalic, atraumatic Eyes: sclera anicteric, no redness Neck: supple Lungs: normal respiratory effort Neurology: alert and oriented Skin: dry, no appreciable rashes Musculoskeletal: no appreciable defects Psychiatric: normal mood and affect Diabetic Foot Exam - Simple   No data filed      Current Medications Patient's Medications  New Prescriptions   BLOOD GLUCOSE MONITORING SUPPL DEVI    1 each by Does not apply route in the morning, at noon, and at bedtime. May substitute to any  manufacturer covered by patient's insurance.   GLUCOSE BLOOD (BLOOD GLUCOSE TEST STRIPS) STRP    1 each by In Vitro route in the morning, at noon, and at bedtime. May substitute to any manufacturer covered by patient's insurance.   LANCET DEVICE MISC    1 each by Does not apply route in the morning, at noon, and at bedtime. May substitute to any manufacturer covered by patient's insurance.   LANCETS MISC. MISC    1 each by Does not apply route in the morning, at noon, and at bedtime. May substitute to any manufacturer covered by patient's insurance.   METFORMIN (GLUCOPHAGE-XR) 500 MG 24 HR TABLET    Take 1 tablet (500 mg total) by mouth 2 (two) times daily with a meal.  Previous Medications   AMOXICILLIN (AMOXIL) 500 MG CAPSULE    Take 1 capsule (500 mg total) by mouth 3 (three) times  daily.   BUPROPION (ZYBAN) 150 MG 12 HR TABLET    Take 150 mg by mouth once.   CLONAZEPAM (KLONOPIN) 0.5 MG TABLET    Take 0.5 mg by mouth at bedtime.   DIPHENOXYLATE-ATROPINE (LOMOTIL) 2.5-0.025 MG TABLET    Take 2 tablets by mouth 4 (four) times daily as needed for diarrhea or loose stools.   ERYTHROMYCIN OPHTHALMIC OINTMENT    Place a 1/2 inch ribbon of ointment into the lower eyelid.   FLUTICASONE (FLONASE) 50 MCG/ACT NASAL SPRAY    Place 2 sprays into both nostrils daily for 14 days.   GLIPIZIDE (GLUCOTROL) 10 MG TABLET    Take 10 mg by mouth 2 (two) times daily.   LISINOPRIL (ZESTRIL) 2.5 MG TABLET    Take 2.5 mg by mouth daily.   SERTRALINE (ZOLOFT) 100 MG TABLET    Take 100 mg by mouth daily.  Modified Medications   No medications on file  Discontinued Medications   No medications on file    Allergies No Known Allergies  Past Medical History Past Medical History:  Diagnosis Date   Borderline diabetes mellitus    Diabetes mellitus without complication (HCC)    Hypertension    Obesity     Past Surgical History Past Surgical History:  Procedure Laterality Date   WISDOM TOOTH EXTRACTION       Family History family history is not on file.  Social History Social History   Socioeconomic History   Marital status: Married    Spouse name: Not on file   Number of children: Not on file   Years of education: Not on file   Highest education level: Not on file  Occupational History   Not on file  Tobacco Use   Smoking status: Never   Smokeless tobacco: Current    Types: Chew  Vaping Use   Vaping status: Never Used  Substance and Sexual Activity   Alcohol use: No   Drug use: No   Sexual activity: Not on file  Other Topics Concern   Not on file  Social History Narrative   Not on file   Social Determinants of Health   Financial Resource Strain: Not on file  Food Insecurity: Not on file  Transportation Needs: Not on file  Physical Activity: Not on file  Stress: Not on file  Social Connections: Not on file  Intimate Partner Violence: Not on file    Lab Results  Component Value Date   HGBA1C 8.9 (A) 03/27/2023   No results found for: "CHOL" No results found for: "HDL" No results found for: "LDLCALC" No results found for: "TRIG" No results found for: "CHOLHDL" Lab Results  Component Value Date   CREATININE 0.93 04/14/2022   No results found for: "GFR" No results found for: "MICROALBUR", "MALB24HUR"    Component Value Date/Time   NA 135 04/14/2022 1250   K 5.0 04/14/2022 1250   CL 102 04/14/2022 1250   CO2 23 04/14/2022 1250   GLUCOSE 134 (H) 04/14/2022 1250   BUN 14 04/14/2022 1250   CREATININE 0.93 04/14/2022 1250   CALCIUM 9.2 04/14/2022 1250   PROT 7.6 04/14/2022 1250   ALBUMIN 3.5 04/14/2022 1250   AST 24 04/14/2022 1250   ALT 28 04/14/2022 1250   ALKPHOS 75 04/14/2022 1250   BILITOT 0.3 04/14/2022 1250   GFRNONAA >60 04/14/2022 1250   GFRAA >60 01/11/2020 1033      Latest Ref Rng & Units 04/14/2022   12:50 PM 04/05/2021  11:39 PM 01/11/2020   10:33 AM  BMP  Glucose 70 - 99 mg/dL 130  865  784   BUN 6 - 20 mg/dL 14  10  9     Creatinine 0.61 - 1.24 mg/dL 6.96  2.95  2.84   Sodium 135 - 145 mmol/L 135  134  135   Potassium 3.5 - 5.1 mmol/L 5.0  3.7  3.8   Chloride 98 - 111 mmol/L 102  100  105   CO2 22 - 32 mmol/L 23  24  23    Calcium 8.9 - 10.3 mg/dL 9.2  9.2  8.3        Component Value Date/Time   WBC 12.5 (H) 04/14/2022 1250   RBC 5.21 04/14/2022 1250   HGB 14.5 04/14/2022 1250   HCT 43.4 04/14/2022 1250   PLT 237 04/14/2022 1250   MCV 83.3 04/14/2022 1250   MCH 27.8 04/14/2022 1250   MCHC 33.4 04/14/2022 1250   RDW 13.8 04/14/2022 1250   LYMPHSABS 1.4 04/14/2022 1250   MONOABS 0.7 04/14/2022 1250   EOSABS 0.1 04/14/2022 1250   BASOSABS 0.0 04/14/2022 1250     Parts of this note may have been dictated using voice recognition software. There may be variances in spelling and vocabulary which are unintentional. Not all errors are proofread. Please notify the Thereasa Parkin if any discrepancies are noted or if the meaning of any statement is not clear.

## 2023-03-27 NOTE — Patient Instructions (Signed)

## 2023-04-04 LAB — C-PEPTIDE: C-Peptide: 3.14 ng/mL (ref 0.80–3.85)

## 2023-04-04 LAB — GAD65, IA-2, AND INSULIN AUTOANTIBODY SERUM
Glutamic Acid Decarb Ab: <5 [IU]/mL (ref <5)
IA-2 Antibody: <5.4 U/mL (ref <5.4)
Insulin Antibodies, Human: <0.4 U/mL (ref <0.4)

## 2023-04-10 ENCOUNTER — Ambulatory Visit: Payer: BC Managed Care – PPO | Admitting: "Endocrinology

## 2023-04-10 ENCOUNTER — Encounter: Payer: Self-pay | Admitting: "Endocrinology

## 2023-04-10 VITALS — BP 115/74 | HR 79 | Ht 68.0 in | Wt 335.4 lb

## 2023-04-10 DIAGNOSIS — E782 Mixed hyperlipidemia: Secondary | ICD-10-CM

## 2023-04-10 DIAGNOSIS — Z7984 Long term (current) use of oral hypoglycemic drugs: Secondary | ICD-10-CM

## 2023-04-10 DIAGNOSIS — E1165 Type 2 diabetes mellitus with hyperglycemia: Secondary | ICD-10-CM | POA: Diagnosis not present

## 2023-04-10 MED ORDER — SYNJARDY 5-1000 MG PO TABS: 1.0000 | ORAL_TABLET | Freq: Two times a day (BID) | ORAL | 5 refills | Status: AC

## 2023-04-10 MED ORDER — TRULICITY 0.75 MG/0.5ML ~~LOC~~ SOAJ: 0.7500 mg | SUBCUTANEOUS | 2 refills | Status: AC

## 2023-04-10 NOTE — Progress Notes (Signed)
Outpatient Endocrinology Note John [place], MD  04/10/23   John Koch 06/22/1986 [id number]  Referring Provider: [facility], [facility] * Primary Care Provider: [facility], [facility] Reason for consultation: Subjective   Assessment & Plan  There are no diagnoses linked to this encounter.   Diabetes Type II complicated by hyperglycemia,  Lab Results  Component Value Date   GFR 118.35 03/27/2023   Hba1c goal less than 7, current Hba1c is  Lab Results  Component Value Date   HGBA1C 8.9 (A) 03/27/2023   Will recommend the following: Replace Metformin XR 500 mg bid with synjardy 10/998 mg bid  Glipizide 10 mg every day Start trulicity 0.75 mg/week after 2 weeks of synjardy (patient would like to try to help with weight loss, couldn't tolerate mounjaro in the past)  No known contraindications/side effects to any of above medications No history of MEN syndrome/medullary thyroid cancer/pancreatitis or pancreatic cancer in self or family  -Last LD and Tg are as follows: Lab Results  Component Value Date   LDLCALC 71 03/27/2023    Lab Results  Component Value Date   TRIG 191.0 (H) 03/27/2023   -not on statin  -Follow low fat diet and exercise   -Blood pressure goal <140/90 - Microalbumin/creatinine goal is < 30 -Last MA/Cr is as follows: Lab Results  Component Value Date   MICROALBUR 10.1 (H) 03/27/2023   -on ACE/ARB lisinopril 2.5 mg qd -diet changes including salt restriction -limit eating outside -counseled BP targets per standards of diabetes care -uncontrolled blood pressure can lead to retinopathy, nephropathy and cardiovascular and atherosclerotic heart disease  Reviewed and counseled on: -A1C target -Blood sugar targets -Complications of uncontrolled diabetes  -Checking blood sugar before meals and bedtime and bring log next visit -All medications with mechanism of action and side effects -Hypoglycemia management: rule of 15's,  Glucagon Emergency Kit and medical alert ID -low-carb low-fat plate-method diet -At least 20 minutes of physical activity per day -Annual dilated retinal eye exam and foot exam -compliance and follow up needs -follow up as scheduled or earlier if problem gets worse  Call if blood sugar is less than 70 or consistently above 250    Take a 15 gm snack of carbohydrate at bedtime before you go to sleep if your blood sugar is less than 100.    If you are going to fast after midnight for a test or procedure, ask your physician for instructions on how to reduce/decrease your insulin dose.    Call if blood sugar is less than 70 or consistently above 250  -Treating a low sugar by rule of 15  (15 gms of sugar every 15 min until sugar is more than 70) If you feel your sugar is low, test your sugar to be sure If your sugar is low (less than 70), then take 15 grams of a fast acting Carbohydrate (3-4 glucose tablets or glucose gel or 4 ounces of juice or regular soda) Recheck your sugar 15 min after treating low to make sure it is more than 70 If sugar is still less than 70, treat again with 15 grams of carbohydrate          Don't drive the hour of hypoglycemia  If unconscious/unable to eat or drink by mouth, use glucagon injection or nasal spray baqsimi and call 911. Can repeat again in 15 min if still unconscious.  Return in about 3 months (around 07/11/2023).   I have reviewed current medications, nurse's notes, allergies,  vital signs, past medical and surgical history, family medical history, and social history for this encounter. Counseled patient on symptoms, examination findings, lab findings, imaging results, treatment decisions and monitoring and prognosis. The patient understood the recommendations and agrees with the treatment plan. All questions regarding treatment plan were fully answered.  John [place], MD  04/10/23    History of Present Illness John Koch is a 37 y.o. year old  male who presents for evaluation of Type II diabetes mellitus.  John Koch was first diagnosed in 2016.   Diabetes education -  Home diabetes regimen: Metformin XR 500 mg bid  Glipizide 10 mg qd  Tried metformin and mounjaro in the past  COMPLICATIONS -  MI/Stroke -  retinopathy -  neuropathy -  nephropathy   BLOOD SUGAR DATA Did not bring meter  98-200s per recall  checks 1-2 times a day  Physical Exam  BP 115/74   Pulse 79   Ht 5\' 8"  (1.727 m)   Wt (!) 335 lb 6.4 oz (152.1 kg)   SpO2 97%   BMI 51.00 kg/m    Constitutional: well developed, well nourished Head: normocephalic, atraumatic Eyes: sclera anicteric, no redness Neck: supple Lungs: normal respiratory effort Neurology: alert and oriented Skin: dry, no appreciable rashes Musculoskeletal: no appreciable defects Psychiatric: normal mood and affect Diabetic Foot Exam - Simple   No data filed      Current Medications Patient's Medications  New Prescriptions   DULAGLUTIDE (TRULICITY) 0.75 MG/0.5ML SOPN    Inject 0.75 mg into the skin once a week.   EMPAGLIFLOZIN-METFORMIN HCL (SYNJARDY) 10-998 MG TABS    Take 1 tablet by mouth 2 (two) times daily after a meal.  Previous Medications   AMOXICILLIN (AMOXIL) 500 MG CAPSULE    Take 1 capsule (500 mg total) by mouth 3 (three) times daily.   BLOOD GLUCOSE MONITORING SUPPL DEVI    1 each by Does not apply route in the morning, at noon, and at bedtime. May substitute to any manufacturer covered by patient's insurance.   BUPROPION (ZYBAN) 150 MG 12 HR TABLET    Take 150 mg by mouth once.   CLONAZEPAM (KLONOPIN) 0.5 MG TABLET    Take 0.5 mg by mouth at bedtime.   DIPHENOXYLATE-ATROPINE (LOMOTIL) 2.5-0.025 MG TABLET    Take 2 tablets by mouth 4 (four) times daily as needed for diarrhea or loose stools.   ERYTHROMYCIN OPHTHALMIC OINTMENT    Place a 1/2 inch ribbon of ointment into the lower eyelid.   FLUTICASONE (FLONASE) 50 MCG/ACT NASAL SPRAY    Place 2 sprays  into both nostrils daily for 14 days.   GLIPIZIDE (GLUCOTROL) 10 MG TABLET    Take 10 mg by mouth 2 (two) times daily.   GLUCOSE BLOOD (BLOOD GLUCOSE TEST STRIPS) STRP    1 each by In Vitro route in the morning, at noon, and at bedtime. May substitute to any manufacturer covered by patient's insurance.   LANCET DEVICE MISC    1 each by Does not apply route in the morning, at noon, and at bedtime. May substitute to any manufacturer covered by patient's insurance.   LANCETS MISC. MISC    1 each by Does not apply route in the morning, at noon, and at bedtime. May substitute to any manufacturer covered by patient's insurance.   LISINOPRIL (ZESTRIL) 2.5 MG TABLET    Take 2.5 mg by mouth daily.   SERTRALINE (ZOLOFT) 100 MG TABLET    Take 100 mg by  mouth daily.  Modified Medications   No medications on file  Discontinued Medications   METFORMIN (GLUCOPHAGE-XR) 500 MG 24 HR TABLET    Take 1 tablet (500 mg total) by mouth 2 (two) times daily with a meal.    Allergies No Known Allergies  Past Medical History Past Medical History:  Diagnosis Date   Borderline diabetes mellitus    Diabetes mellitus without complication (HCC)    Hypertension    Obesity     Past Surgical History Past Surgical History:  Procedure Laterality Date   WISDOM TOOTH EXTRACTION      Family History family history is not on file.  Social History Social History   Socioeconomic History   Marital status: Married    Spouse name: Not on file   Number of children: Not on file   Years of education: Not on file   Highest education level: Not on file  Occupational History   Not on file  Tobacco Use   Smoking status: Never   Smokeless tobacco: Current    Types: Chew  Vaping Use   Vaping status: Never Used  Substance and Sexual Activity   Alcohol use: No   Drug use: No   Sexual activity: Not on file  Other Topics Concern   Not on file  Social History Narrative   Not on file   Social Determinants of Health    Financial Resource Strain: Not on file  Food Insecurity: Not on file  Transportation Needs: Not on file  Physical Activity: Not on file  Stress: Not on file  Social Connections: Not on file  Intimate Partner Violence: Not on file    Lab Results  Component Value Date   HGBA1C 8.9 (A) 03/27/2023   Lab Results  Component Value Date   CHOL 139 03/27/2023   Lab Results  Component Value Date   HDL 29.80 (L) 03/27/2023   Lab Results  Component Value Date   LDLCALC 71 03/27/2023   Lab Results  Component Value Date   TRIG 191.0 (H) 03/27/2023   Lab Results  Component Value Date   CHOLHDL 5 03/27/2023   Lab Results  Component Value Date   CREATININE 0.73 03/27/2023   Lab Results  Component Value Date   GFR 118.35 03/27/2023   Lab Results  Component Value Date   MICROALBUR 10.1 (H) 03/27/2023      Component Value Date/Time   NA 135 03/27/2023 0857   K 4.2 03/27/2023 0857   CL 101 03/27/2023 0857   CO2 27 03/27/2023 0857   GLUCOSE 193 (H) 03/27/2023 0857   BUN 15 03/27/2023 0857   CREATININE 0.73 03/27/2023 0857   CALCIUM 9.0 03/27/2023 0857   PROT 6.9 03/27/2023 0857   ALBUMIN 4.1 03/27/2023 0857   AST 25 03/27/2023 0857   ALT 38 03/27/2023 0857   ALKPHOS 77 03/27/2023 0857   BILITOT 0.6 03/27/2023 0857   GFRNONAA >60 04/14/2022 1250   GFRAA >60 01/11/2020 1033      Latest Ref Rng & Units 03/27/2023    8:57 AM 04/14/2022   12:50 PM 04/05/2021   11:39 PM  BMP  Glucose 70 - 99 mg/dL 161  096  045   BUN 6 - 23 mg/dL 15  14  10    Creatinine 0.40 - 1.50 mg/dL 4.09  8.11  9.14   Sodium 135 - 145 mEq/L 135  135  134   Potassium 3.5 - 5.1 mEq/L 4.2  5.0  3.7   Chloride  96 - 112 mEq/L 101  102  100   CO2 19 - 32 mEq/L 27  23  24    Calcium 8.4 - 10.5 mg/dL 9.0  9.2  9.2        Component Value Date/Time   WBC 12.5 (H) 04/14/2022 1250   RBC 5.21 04/14/2022 1250   HGB 14.5 04/14/2022 1250   HCT 43.4 04/14/2022 1250   PLT 237 04/14/2022 1250   MCV 83.3  04/14/2022 1250   MCH 27.8 04/14/2022 1250   MCHC 33.4 04/14/2022 1250   RDW 13.8 04/14/2022 1250   LYMPHSABS 1.4 04/14/2022 1250   MONOABS 0.7 04/14/2022 1250   EOSABS 0.1 04/14/2022 1250   BASOSABS 0.0 04/14/2022 1250     Parts of this note may have been dictated using voice recognition software. There may be variances in spelling and vocabulary which are unintentional. Not all errors are proofread. Please notify the Thereasa Parkin if any discrepancies are noted or if the meaning of any statement is not clear.

## 2023-07-03 DIAGNOSIS — E1165 Type 2 diabetes mellitus with hyperglycemia: Secondary | ICD-10-CM | POA: Diagnosis not present

## 2023-07-03 DIAGNOSIS — F321 Major depressive disorder, single episode, moderate: Secondary | ICD-10-CM | POA: Diagnosis not present

## 2023-07-03 DIAGNOSIS — Z79899 Other long term (current) drug therapy: Secondary | ICD-10-CM | POA: Diagnosis not present

## 2023-07-14 ENCOUNTER — Ambulatory Visit: Payer: BC Managed Care – PPO | Admitting: "Endocrinology

## 2023-11-09 DIAGNOSIS — R051 Acute cough: Secondary | ICD-10-CM | POA: Diagnosis not present

## 2023-11-09 DIAGNOSIS — J101 Influenza due to other identified influenza virus with other respiratory manifestations: Secondary | ICD-10-CM | POA: Diagnosis not present

## 2023-12-16 DIAGNOSIS — R1110 Vomiting, unspecified: Secondary | ICD-10-CM | POA: Diagnosis not present

## 2023-12-16 DIAGNOSIS — Z7984 Long term (current) use of oral hypoglycemic drugs: Secondary | ICD-10-CM | POA: Diagnosis not present

## 2023-12-16 DIAGNOSIS — R000 Tachycardia, unspecified: Secondary | ICD-10-CM | POA: Diagnosis not present

## 2023-12-16 DIAGNOSIS — R6883 Chills (without fever): Secondary | ICD-10-CM | POA: Diagnosis not present

## 2023-12-16 DIAGNOSIS — E119 Type 2 diabetes mellitus without complications: Secondary | ICD-10-CM | POA: Diagnosis not present

## 2023-12-16 DIAGNOSIS — Z79899 Other long term (current) drug therapy: Secondary | ICD-10-CM | POA: Diagnosis not present

## 2023-12-16 DIAGNOSIS — Z20822 Contact with and (suspected) exposure to covid-19: Secondary | ICD-10-CM | POA: Diagnosis not present

## 2023-12-16 DIAGNOSIS — K529 Noninfective gastroenteritis and colitis, unspecified: Secondary | ICD-10-CM | POA: Diagnosis not present

## 2023-12-16 DIAGNOSIS — I10 Essential (primary) hypertension: Secondary | ICD-10-CM | POA: Diagnosis not present

## 2024-01-09 ENCOUNTER — Encounter: Payer: Self-pay | Admitting: Advanced Practice Midwife

## 2024-01-19 DIAGNOSIS — E1165 Type 2 diabetes mellitus with hyperglycemia: Secondary | ICD-10-CM | POA: Diagnosis not present

## 2024-01-19 DIAGNOSIS — Z79899 Other long term (current) drug therapy: Secondary | ICD-10-CM | POA: Diagnosis not present

## 2024-01-19 DIAGNOSIS — F321 Major depressive disorder, single episode, moderate: Secondary | ICD-10-CM | POA: Diagnosis not present

## 2024-01-19 DIAGNOSIS — E7801 Familial hypercholesterolemia: Secondary | ICD-10-CM | POA: Diagnosis not present

## 2024-05-27 DIAGNOSIS — Z91148 Patient's other noncompliance with medication regimen for other reason: Secondary | ICD-10-CM | POA: Diagnosis not present

## 2024-05-27 DIAGNOSIS — F321 Major depressive disorder, single episode, moderate: Secondary | ICD-10-CM | POA: Diagnosis not present

## 2024-05-27 DIAGNOSIS — E1165 Type 2 diabetes mellitus with hyperglycemia: Secondary | ICD-10-CM | POA: Diagnosis not present

## 2024-05-27 DIAGNOSIS — Z79899 Other long term (current) drug therapy: Secondary | ICD-10-CM | POA: Diagnosis not present

## 2024-05-27 DIAGNOSIS — E785 Hyperlipidemia, unspecified: Secondary | ICD-10-CM | POA: Diagnosis not present

## 2024-06-04 DIAGNOSIS — G2581 Restless legs syndrome: Secondary | ICD-10-CM | POA: Diagnosis not present
